# Patient Record
Sex: Female | Born: 1990
Health system: Southern US, Community
[De-identification: ages and names within clinical notes are randomized; demographics above are authoritative.]

## PROBLEM LIST (undated history)

## (undated) ENCOUNTER — Inpatient Hospital Stay (HOSPITAL_COMMUNITY): Payer: Self-pay

## (undated) DIAGNOSIS — N96 Recurrent pregnancy loss: Secondary | ICD-10-CM

## (undated) DIAGNOSIS — Z6841 Body Mass Index (BMI) 40.0 and over, adult: Secondary | ICD-10-CM

## (undated) DIAGNOSIS — O24419 Gestational diabetes mellitus in pregnancy, unspecified control: Secondary | ICD-10-CM

## (undated) DIAGNOSIS — R519 Headache, unspecified: Secondary | ICD-10-CM

## (undated) HISTORY — DX: Body Mass Index (BMI) 40.0 and over, adult: Z684

## (undated) HISTORY — DX: Gestational diabetes mellitus in pregnancy, unspecified control: O24.419

## (undated) HISTORY — DX: Recurrent pregnancy loss: N96

## (undated) NOTE — Anesthesia Preprocedure Evaluation (Signed)
 Formatting of this note is different from the original. Anesthesia ROS/Med History  The patient does not have a history of anesthetic complications.   Neuro/Psych  + current substance abuse: abuses marijuana  + psychiatric history, type: depression and anxiety  Pulmonary  + tobacco use  + ex-smoker Cardiovascular  + hypertension (Hx PIH) + hyperlipidemia   GI/Hepatic/Renal  + GERD  Undergoing eval for bariatric surg.:  Hematology  Patient has a negative hematology ROS  Endo/Cancer/Other  Diabetes: Hx Gestational DM. + obesity (MORBID--BMI 46.3)  11/01/22 08:52 Hemoglobin A1c: 4.8 Mean Glucose: 91.06:   Other Review of Systems findings:  Pre-op diagnosis: Gastroesophageal reflux disease, unspecified whether esophagitis present (K21.9)  Anesthesia Physical Exam Airway  Mallampati: III Pulmonary  Normal systems: pulmonary exam normal - wheezes:  Cardiovascular  rate: normal   Removed tongue ring in preop. Left lower lip piercing (plastic).PreOp vitals: BP: 115/66 (12/02/2022 12:56 PM) TEMP: 36.4 C (12/02/2022 12:56 PM) Pulse:  54 (12/02/2022 12:56 PM) Resp:  16 (12/02/2022 12:56 PM) SPO2: 100 % (12/02/2022 12:56 PM)  Anesthesia Plan ASA Score: 3   Anesthesia Plan of Care:  general IV Plan Monitor  Monitor: routine Postoperative Plan  Post Operative Plan: routine  Informed consent  Anesthetic Plan and risks discussed with the patient.  Discussed the anesthetic plan with the Anesthesiologist and CRNA.  Information  H & P update: patient reports no changes  Relevant Problems   Stop Bang Score/Comments: No value filed. Mini-Cog Score: No value filed. PONV Risk Score: No value filed. Frail Scale Score: No value filed.   Electronically signed by Barnie DELENA Begun, RN at 11/30/2022 11:11 AM EDT Electronically signed by Arlyne Carbo, MD at 11/30/2022  2:00 PM EDT Electronically signed by Olam DELENA. Leartis, DO at 12/02/2022  1:27 PM EDT

## (undated) NOTE — Anesthesia Postprocedure Evaluation (Signed)
 Formatting of this note is different from the original. Patient: Kelly Stevens  Procedure(s) Performed: Procedure(s): EGD  Anesthesia Post Evaluation  Patient participation: Patient participated Level of consciousness: awake and alert Pain score: 0 Airway patency: patent Cardiovascular status: stable Respiratory status: stable Hydration status: euvolemic Nausea and Vomiting: none  No notable events documented.  Last Vitals:  Vitals Value Taken Time  BP 105/84 12/02/22 1415  Temp 36.2 C 12/02/22 1415  Pulse 66 12/02/22 1415  Resp 16 12/02/22 1415  SpO2 100 % 12/02/22 1415  SpO2 Pulse Rate     Per GI nursing notes: GI Symptoms: None  First PACU pain score  12/02/2022 1349 - 12/02/2022 1434     12/02/2022 1405      Pain Score: 0-No pain    Electronically signed by Olam LABOR. Leartis, DO at 12/02/2022  2:38 PM EDT

---

## 2013-05-08 ENCOUNTER — Other Ambulatory Visit (HOSPITAL_COMMUNITY)
Admission: RE | Admit: 2013-05-08 | Discharge: 2013-05-08 | Disposition: A | Discharge status: Home / Self Care | Payer: BC Managed Care – PPO | Source: Ambulatory Visit | Attending: Obstetrics and Gynecology | Admitting: Obstetrics and Gynecology

## 2013-05-08 DIAGNOSIS — Z01419 Encounter for gynecological examination (general) (routine) without abnormal findings: Secondary | ICD-10-CM | POA: Insufficient documentation

## 2015-08-15 ENCOUNTER — Emergency Department (HOSPITAL_COMMUNITY)
Admission: EM | Admit: 2015-08-15 | Discharge: 2015-08-15 | Disposition: A | Discharge status: Home / Self Care | Payer: No Typology Code available for payment source | Attending: Emergency Medicine | Admitting: Emergency Medicine

## 2015-08-15 ENCOUNTER — Emergency Department (HOSPITAL_COMMUNITY): Payer: No Typology Code available for payment source

## 2015-08-15 ENCOUNTER — Encounter (HOSPITAL_COMMUNITY): Payer: Self-pay | Admitting: Emergency Medicine

## 2015-08-15 DIAGNOSIS — S199XXA Unspecified injury of neck, initial encounter: Secondary | ICD-10-CM | POA: Diagnosis not present

## 2015-08-15 DIAGNOSIS — Y92410 Unspecified street and highway as the place of occurrence of the external cause: Secondary | ICD-10-CM | POA: Diagnosis not present

## 2015-08-15 DIAGNOSIS — Y998 Other external cause status: Secondary | ICD-10-CM | POA: Diagnosis not present

## 2015-08-15 DIAGNOSIS — Y9389 Activity, other specified: Secondary | ICD-10-CM | POA: Diagnosis not present

## 2015-08-15 MED ORDER — IBUPROFEN 800 MG PO TABS: 800.0000 mg | ORAL_TABLET | Freq: Three times a day (TID) | ORAL | Status: DC | PRN

## 2015-08-15 MED ORDER — IBUPROFEN 400 MG PO TABS
600.0000 mg | ORAL_TABLET | Freq: Once | ORAL | Status: AC
  Administered 2015-08-15: 600 mg via ORAL
  Filled 2015-08-15: qty 1

## 2015-08-15 MED ORDER — HYDROCODONE-ACETAMINOPHEN 5-325 MG PO TABS: 1.0000 | ORAL_TABLET | Freq: Four times a day (QID) | ORAL | Status: DC | PRN

## 2015-08-15 NOTE — Discharge Instructions (Signed)
Return here as needed.  Follow-up with your doctor for the abnormal findings that we discussed or the Dr. Provided.
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
## 2015-08-15 NOTE — ED Notes (Signed)
Pt able to ambulate independently 
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
## 2017-09-25 ENCOUNTER — Inpatient Hospital Stay (HOSPITAL_COMMUNITY): Payer: Self-pay

## 2017-09-25 ENCOUNTER — Encounter (HOSPITAL_COMMUNITY): Payer: Self-pay | Admitting: *Deleted

## 2017-09-25 ENCOUNTER — Other Ambulatory Visit: Payer: Self-pay

## 2017-09-25 ENCOUNTER — Inpatient Hospital Stay (HOSPITAL_COMMUNITY)
Admission: AD | Admit: 2017-09-25 | Discharge: 2017-09-25 | Disposition: A | Discharge status: Home / Self Care | Payer: Self-pay | Source: Ambulatory Visit | Attending: Obstetrics and Gynecology | Admitting: Obstetrics and Gynecology

## 2017-09-25 DIAGNOSIS — Z87891 Personal history of nicotine dependence: Secondary | ICD-10-CM | POA: Insufficient documentation

## 2017-09-25 DIAGNOSIS — O3680X0 Pregnancy with inconclusive fetal viability, not applicable or unspecified: Secondary | ICD-10-CM

## 2017-09-25 DIAGNOSIS — O26891 Other specified pregnancy related conditions, first trimester: Secondary | ICD-10-CM | POA: Insufficient documentation

## 2017-09-25 DIAGNOSIS — O99211 Obesity complicating pregnancy, first trimester: Secondary | ICD-10-CM | POA: Insufficient documentation

## 2017-09-25 DIAGNOSIS — O209 Hemorrhage in early pregnancy, unspecified: Secondary | ICD-10-CM

## 2017-09-25 DIAGNOSIS — Z3A01 Less than 8 weeks gestation of pregnancy: Secondary | ICD-10-CM | POA: Insufficient documentation

## 2017-09-25 DIAGNOSIS — R030 Elevated blood-pressure reading, without diagnosis of hypertension: Secondary | ICD-10-CM | POA: Insufficient documentation

## 2017-09-25 LAB — CBC
HEMATOCRIT: 36.2 % (ref 36.0–46.0)
HEMOGLOBIN: 11.6 g/dL — AB (ref 12.0–15.0)
MCH: 23.8 pg — ABNORMAL LOW (ref 26.0–34.0)
MCHC: 32 g/dL (ref 30.0–36.0)
MCV: 74.2 fL — AB (ref 78.0–100.0)
Platelets: 309 10*3/uL (ref 150–400)
RBC: 4.88 MIL/uL (ref 3.87–5.11)
RDW: 15.5 % (ref 11.5–15.5)
WBC: 7 10*3/uL (ref 4.0–10.5)

## 2017-09-25 LAB — URINALYSIS, ROUTINE W REFLEX MICROSCOPIC
BILIRUBIN URINE: NEGATIVE
Bacteria, UA: NONE SEEN
GLUCOSE, UA: NEGATIVE mg/dL
Ketones, ur: NEGATIVE mg/dL
LEUKOCYTES UA: NEGATIVE
NITRITE: NEGATIVE
PH: 7 (ref 5.0–8.0)
Protein, ur: 30 mg/dL — AB
SPECIFIC GRAVITY, URINE: 1.024 (ref 1.005–1.030)

## 2017-09-25 LAB — HCG, QUANTITATIVE, PREGNANCY: hCG, Beta Chain, Quant, S: 5103 m[IU]/mL — ABNORMAL HIGH (ref <5)

## 2017-09-25 LAB — WET PREP, GENITAL
CLUE CELLS WET PREP: NONE SEEN
SPERM: NONE SEEN
Trich, Wet Prep: NONE SEEN
WBC WET PREP: NONE SEEN
YEAST WET PREP: NONE SEEN

## 2017-09-25 LAB — ABO/RH: ABO/RH(D): B POS

## 2017-09-25 LAB — POCT PREGNANCY, URINE: Preg Test, Ur: POSITIVE — AB

## 2017-09-25 NOTE — MAU Provider Note (Signed)
History     CSN: [id number]  Arrival date and time: 09/25/17 1150   First Provider Initiated Contact with Patient 09/25/17 1234      Chief Complaint  Patient presents with  . Vaginal Bleeding   HPI Kelly Stevens 27 y.o. [redacted]w[redacted]d  Did a home pregnancy test that was positive last week.  Started having vaginal bleeding today and is worried she is having a miscarriage.  She does have some mild pain  - the same pain that she has had since she missed her period.  It is no worse.  Has not yet started prenatal care.  Was scheduled in [facility] for a pregnancy confirmation this week.  OB History    Gravida Para Term Preterm AB Living   1             SAB TAB Ectopic Multiple Live Births                  History reviewed. No pertinent past medical history.  History reviewed. No pertinent surgical history.  Family History - strong family history of hypertension  Social History   Tobacco Use  . Smoking status: Former Smoker    Last attempt to quit: 09/25/2017  . Smokeless tobacco: Former Engineer, water Use Topics  . Alcohol use: No  . Drug use: No    Allergies: No Known Allergies  Medications Prior to Admission  Medication Sig Dispense Refill Last Dose  . HYDROcodone-acetaminophen (NORCO/VICODIN) 5-325 MG tablet Take 1 tablet by mouth every 6 (six) hours as needed for moderate pain. 15 tablet 0   . ibuprofen (ADVIL,MOTRIN) 800 MG tablet Take 1 tablet (800 mg total) by mouth every 8 (eight) hours as needed. 21 tablet 0     Review of Systems  Constitutional: Negative for fever.  Gastrointestinal: Negative for nausea and vomiting.       Mild lower abdominal cramping  Genitourinary: Positive for vaginal bleeding. Negative for vaginal discharge.       Passed one small clot at home - approx quarter sized   Physical Exam   Blood pressure (!) 141/95, pulse (!) 180, temperature 98.5 F (36.9 C), temperature source Oral, resp. rate 20, height 5\' 6"  (1.676 m), weight (!) 332 lb  12 oz (150.9 kg), SpO2 99 %.  Pulse was rechecked by continuous pulse ox.  O2 sat was 96-100%  Pulse was always 90-120 with most often around 100-110.    Physical Exam  Nursing note and vitals reviewed. Constitutional: She is oriented to person, place, and time. She appears well-developed and well-nourished.  Morbidly obese  HENT:  Head: Normocephalic.  Eyes: EOM are normal.  Neck: Neck supple.  GI: Soft. There is no tenderness. There is no rebound and no guarding.  Genitourinary:  Genitourinary Comments: Speculum exam: Vagina - small to moderate amount of dark blood, no odor Cervix - Unable to visualize Bimanual exam: Cervix  - unable to palpate - is behind the prominent pelvic bone. Uterus Not able to be sized due to habitus Adnexa non tender, exam limited by habitus GC/Chlam, wet prep done Chaperone present for exam.   Musculoskeletal: Normal range of motion.  Neurological: She is alert and oriented to person, place, and time.  Skin: Skin is warm and dry.  Psychiatric: She has a normal mood and affect.    MAU Course  Procedures Results for orders placed or performed during the hospital encounter of 09/25/17 (from the past 24 hour(s))  Urinalysis, Routine w  reflex microscopic     Status: Abnormal   Collection Time: 09/25/17 12:00 PM  Result Value Ref Range   Color, Urine YELLOW YELLOW   APPearance HAZY (A) CLEAR   Specific Gravity, Urine 1.024 1.005 - 1.030   pH 7.0 5.0 - 8.0   Glucose, UA NEGATIVE NEGATIVE mg/dL   Hgb urine dipstick LARGE (A) NEGATIVE   Bilirubin Urine NEGATIVE NEGATIVE   Ketones, ur NEGATIVE NEGATIVE mg/dL   Protein, ur 30 (A) NEGATIVE mg/dL   Nitrite NEGATIVE NEGATIVE   Leukocytes, UA NEGATIVE NEGATIVE   RBC / HPF TOO NUMEROUS TO COUNT 0 - 5 RBC/hpf   WBC, UA 6-30 0 - 5 WBC/hpf   Bacteria, UA NONE SEEN NONE SEEN   Squamous Epithelial / LPF 6-30 (A) NONE SEEN   Mucus PRESENT   Pregnancy, urine POC     Status: Abnormal   Collection Time:  09/25/17 12:07 PM  Result Value Ref Range   Preg Test, Ur POSITIVE (A) NEGATIVE  CBC     Status: Abnormal   Collection Time: 09/25/17 12:49 PM  Result Value Ref Range   WBC 7.0 4.0 - 10.5 K/uL   RBC 4.88 3.87 - 5.11 MIL/uL   Hemoglobin 11.6 (L) 12.0 - 15.0 g/dL   HCT 09.8 11.9 - 14.7 %   MCV 74.2 (L) 78.0 - 100.0 fL   MCH 23.8 (L) 26.0 - 34.0 pg   MCHC 32.0 30.0 - 36.0 g/dL   RDW 82.9 56.2 - 13.0 %   Platelets 309 150 - 400 K/uL  hCG, quantitative, pregnancy     Status: Abnormal   Collection Time: 09/25/17 12:49 PM  Result Value Ref Range   hCG, Beta Chain, Quant, S 5,103 (H) <5 mIU/mL  ABO/Rh     Status: None   Collection Time: 09/25/17 12:49 PM  Result Value Ref Range   ABO/RH(D) B POS   Wet prep, genital     Status: None   Collection Time: 09/25/17  1:45 PM  Result Value Ref Range   Yeast Wet Prep HPF POC NONE SEEN NONE SEEN   Trich, Wet Prep NONE SEEN NONE SEEN   Clue Cells Wet Prep HPF POC NONE SEEN NONE SEEN   WBC, Wet Prep HPF POC NONE SEEN NONE SEEN   Sperm NONE SEEN    CLINICAL DATA:  Vaginal bleeding in first trimester of pregnancy, bleeding with clots for 1 day, cramps  EXAM: OBSTETRIC <14 WK [place] AND TRANSVAGINAL OB US  TECHNIQUE: Both transabdominal and transvaginal ultrasound examinations were performed for complete evaluation of the gestation as well as the maternal uterus, adnexal regions, and pelvic cul-de-sac. Transvaginal technique was performed to assess early pregnancy.  COMPARISON:  None  FINDINGS: Intrauterine gestational sac: None  Yolk sac:  N/A  Embryo:  N/A  Cardiac Activity: N/A  Heart Rate: N/A  bpm  Subchorionic hemorrhage:  N/A  Maternal uterus/adnexae:  Uterus appears normal in size with a borderline thickened and heterogeneous appearing endometrial complex 15 mm thick; no definite gestational sac or products of conception identified.  RIGHT ovary normal size and morphology 3.7 x 1.5 x 1.9 cm.  LEFT ovary  not visualized.  No free pelvic fluid or adnexal masses.  IMPRESSION: No intrauterine gestation identified.  Findings are compatible with pregnancy of unknown location.  Differential diagnosis includes early intrauterine pregnancy too early to visualize, spontaneous abortion, and ectopic pregnancy.  Serial quantitative beta hCG and or followup ultrasound recommended to definitively exclude ectopic pregnancy.  Heterogeneous  appearance of the endometrial complex up to 15 mm thick, nonspecific.   MDM Blood type is B positive Consult with Dr. Jolayne Pantheronstant - will follow up for quant in 48 hours. BP on discharge was 132/63 Discussed with client and her partner:  Possibility of ectopic pregnancy, an early but healthy pregnancy or a threatened miscarriage - will need further evaluation to determine which is occurring.  Discussed the importance of follow up as an ectopic pregnancy can be life threatening.  Assessment and Plan  Pregnancy of unknown anatomic location Bleeding in pregnancy in first trimester Morbid obesity Elevated BP without diagnosis of hypertension -   Plan Come on Wednesday at 1:30 pm [facility]  Plan to be at the appointment for 2 hours. Return to MAU with severe vaginal bleeding or severe abdominal pain.  Pelvic rest - no tampons, no douching, no sex, nothing in the vagina. No heavy lifting or strenuous exercise. Drink at least 8 8-oz glasses of water every day. Take Tylenol 325 mg 2 tablets by mouth every 4 hours if needed for pain. will need to watch BP carefully in this pregnancy.   Julius Boniface L Jerrell Mangel 09/25/2017, 1:52 PM

## 2017-09-25 NOTE — Discharge Instructions (Signed)
Come on Wednesday at 1:30 pm for lab work.  Plan to be at the appointment for 2 hours. Return to MAU with severe vaginal bleeding or severe abdominal pain.  Pelvic rest - no tampons, no douching, no sex, nothing in the vagina. No heavy lifting or strenuous exercise. Drink at least 8 8-oz glasses of water every day. Take Tylenol 325 mg 2 tablets by mouth every 4 hours if needed for pain.

## 2017-09-25 NOTE — MAU Note (Signed)
Pt. Here with vaginal bleeding that started this morning, with "regular period clots". Pain 2/10 mild uterine cramps.

## 2017-09-26 ENCOUNTER — Encounter: Payer: Self-pay | Admitting: Family Medicine

## 2017-09-26 LAB — GC/CHLAMYDIA PROBE AMP (~~LOC~~) NOT AT ARMC
Chlamydia: NEGATIVE
Neisseria Gonorrhea: NEGATIVE

## 2017-09-26 LAB — RPR: RPR Ser Ql: NONREACTIVE

## 2017-09-26 LAB — HIV ANTIBODY (ROUTINE TESTING W REFLEX): HIV SCREEN 4TH GENERATION: NONREACTIVE

## 2017-09-27 ENCOUNTER — Encounter: Payer: Self-pay | Admitting: General Practice

## 2017-09-27 ENCOUNTER — Ambulatory Visit: Payer: Self-pay | Admitting: General Practice

## 2017-09-27 DIAGNOSIS — O3680X0 Pregnancy with inconclusive fetal viability, not applicable or unspecified: Secondary | ICD-10-CM

## 2017-09-27 LAB — HCG, QUANTITATIVE, PREGNANCY: hCG, Beta Chain, Quant, S: 646 m[IU]/mL — ABNORMAL HIGH (ref <5)

## 2017-09-27 NOTE — Progress Notes (Signed)
Patient here for stat bhcg today. Patient reports bleeding has slowed down to spotting. Patient denies pain. Told patient we are monitoring her beta hcg levels today and asked she wait in lobby for results/updated plan of care. Patient verbalized understanding & had no questions at this time  Reviewed results with Cleone Slimaroline Neill who finds decreasing bhcg levels consistent with SAB. Patient should have follow up bhcg in 1 week.  Informed patient of results & recommended follow up. Patient verbalized understanding and had no questions

## 2017-09-27 NOTE — Progress Notes (Signed)
Patient ID: Kelly Stevens, female   DOB: 1991/04/25, 27 y.o.   MRN: [id number]  Upon checking out patient, patient stated that she did not need to be scheduled to see a provider for SAB follow up.  Asked patient is she was sure and she said yes. She stated that she will call if she feel that she need to see a doctor.  Patient is scheduled for follow up lab visit.

## 2017-09-28 NOTE — Progress Notes (Signed)
Chart reviewed for nurse visit. Agree with plan of care.   Rolm Bookbindereill, Caroline M, [place] 09/28/2017 12:55 PM

## 2017-10-06 ENCOUNTER — Other Ambulatory Visit: Payer: Self-pay

## 2017-10-06 DIAGNOSIS — O039 Complete or unspecified spontaneous abortion without complication: Secondary | ICD-10-CM

## 2018-08-15 ENCOUNTER — Encounter (HOSPITAL_COMMUNITY): Payer: Self-pay

## 2019-01-04 ENCOUNTER — Ambulatory Visit (INDEPENDENT_AMBULATORY_CARE_PROVIDER_SITE_OTHER): Payer: Self-pay | Admitting: *Deleted

## 2019-01-04 ENCOUNTER — Other Ambulatory Visit: Payer: Self-pay

## 2019-01-04 ENCOUNTER — Encounter: Payer: Self-pay | Admitting: Family Medicine

## 2019-01-04 VITALS — BP 153/77 | HR 69 | Ht 65.5 in | Wt 325.2 lb

## 2019-01-04 DIAGNOSIS — Z3201 Encounter for pregnancy test, result positive: Secondary | ICD-10-CM

## 2019-01-04 DIAGNOSIS — Z8759 Personal history of other complications of pregnancy, childbirth and the puerperium: Secondary | ICD-10-CM

## 2019-01-04 LAB — POCT PREGNANCY, URINE: Preg Test, Ur: POSITIVE — AB

## 2019-01-04 MED ORDER — PROGESTERONE MICRONIZED 200 MG PO CAPS: 200.0000 mg | ORAL_CAPSULE | Freq: Every day | ORAL | 1 refills | Status: DC

## 2019-01-04 NOTE — Progress Notes (Signed)
Pt presents for pregnancy test.  Pregnancy test resulted positive.   LMP: 12/01/18 EDD:09/06/18 GA:[redacted]w[redacted]d  Pt states she miscarried twice last year and the doctor she went to and she saw that her progesterone was 7 during each test and that she was told she should have been prescribed progesterone.  Pt states she was told that with her next pregnancy, she should be prescribe progesterone early. Reviewed with Dr. Vergie Living.  Pt prescribed Prometrium 200mg  to be adminstered vaginally nightly.  Pt verbalized understanding.

## 2019-01-07 ENCOUNTER — Other Ambulatory Visit: Payer: Self-pay

## 2019-01-07 ENCOUNTER — Emergency Department
Admission: EM | Admit: 2019-01-07 | Discharge: 2019-01-07 | Disposition: A | Discharge status: Home / Self Care | Payer: Self-pay | Attending: Student in an Organized Health Care Education/Training Program | Admitting: Student in an Organized Health Care Education/Training Program

## 2019-01-07 ENCOUNTER — Telehealth: Payer: Self-pay

## 2019-01-07 ENCOUNTER — Encounter: Payer: Self-pay | Admitting: Emergency Medicine

## 2019-01-07 ENCOUNTER — Emergency Department: Payer: Self-pay

## 2019-01-07 DIAGNOSIS — O4691 Antepartum hemorrhage, unspecified, first trimester: Secondary | ICD-10-CM | POA: Insufficient documentation

## 2019-01-07 DIAGNOSIS — Z3A00 Weeks of gestation of pregnancy not specified: Secondary | ICD-10-CM | POA: Insufficient documentation

## 2019-01-07 DIAGNOSIS — O4690 Antepartum hemorrhage, unspecified, unspecified trimester: Secondary | ICD-10-CM

## 2019-01-07 LAB — CBC
HCT: 37.5 % (ref 36.0–46.0)
Hemoglobin: 11.6 g/dL — ABNORMAL LOW (ref 12.0–15.0)
MCH: 22.7 pg — ABNORMAL LOW (ref 26.0–34.0)
MCHC: 30.9 g/dL (ref 30.0–36.0)
MCV: 73.2 fL — ABNORMAL LOW (ref 80.0–100.0)
Platelets: 362 10*3/uL (ref 150–400)
RBC: 5.12 MIL/uL — ABNORMAL HIGH (ref 3.87–5.11)
RDW: 16.1 % — ABNORMAL HIGH (ref 11.5–15.5)
WBC: 6.4 10*3/uL (ref 4.0–10.5)
nRBC: 0 % (ref 0.0–0.2)

## 2019-01-07 LAB — COMPREHENSIVE METABOLIC PANEL
ALT: 13 U/L (ref 0–44)
AST: 17 U/L (ref 15–41)
Albumin: 4 g/dL (ref 3.5–5.0)
Alkaline Phosphatase: 88 U/L (ref 38–126)
Anion gap: 9 (ref 5–15)
BUN: 14 mg/dL (ref 6–20)
CO2: 23 mmol/L (ref 22–32)
Calcium: 8.9 mg/dL (ref 8.9–10.3)
Chloride: 104 mmol/L (ref 98–111)
Creatinine, Ser: 0.96 mg/dL (ref 0.44–1.00)
GFR calc Af Amer: >60 mL/min (ref >60)
GFR calc non Af Amer: >60 mL/min (ref >60)
Glucose, Bld: 106 mg/dL — ABNORMAL HIGH (ref 70–99)
Potassium: 3.9 mmol/L (ref 3.5–5.1)
Sodium: 136 mmol/L (ref 135–145)
Total Bilirubin: 0.2 mg/dL — ABNORMAL LOW (ref 0.3–1.2)
Total Protein: 7.7 g/dL (ref 6.5–8.1)

## 2019-01-07 LAB — SAMPLE TO BLOOD BANK

## 2019-01-07 LAB — POCT PREGNANCY, URINE: Preg Test, Ur: NEGATIVE

## 2019-01-07 LAB — HCG, QUANTITATIVE, PREGNANCY: hCG, Beta Chain, Quant, S: 48 m[IU]/mL — ABNORMAL HIGH (ref <5)

## 2019-01-07 NOTE — Progress Notes (Signed)
I have reviewed the chart and agree with nursing staff's documentation of this patient's encounter.  Dante Bing, MD 01/07/2019 11:31 AM

## 2019-01-07 NOTE — ED Triage Notes (Signed)
Pt reports no pain to abd.

## 2019-01-07 NOTE — ED Notes (Signed)
Patient report did 5 pregnancy test at home begging last Friday all confirming positive pregnancy, also report seen at [facility] clinic  On Friday to confirm pregnancy. Was to she was pregnant. Patient in ed due to bleeding that began spotting on saturday and now having heavy bleeding that began today.

## 2019-01-07 NOTE — ED Notes (Signed)
No answer when called 

## 2019-01-07 NOTE — ED Notes (Signed)
Pt gave enough urine for POC, did not collect enough urine to send to the lab.

## 2019-01-07 NOTE — ED Provider Notes (Signed)
[facility] Emergency Department Provider Note    First MD Initiated Contact with Patient 01/07/19 1728     (approximate)  I have reviewed the triage vital signs and the nursing notes.   HISTORY  Chief Complaint Vaginal Bleeding    HPI Kelly Stevens is a 28 y.o. female presents the ER for several days of vaginal spotting.  She is roughly [redacted]weeks pregnant by LMP.  Is not having any pain.  Came to ER because she started passing some blood clots.  Does have a history of 2 miscarriages.  Denies any nausea or vomiting.  No trauma.  States that she has been taking progesterone suppositories as previously prescribed recommended by her OB/GYN.    History reviewed. No pertinent past medical history. No family history on file. History reviewed. No pertinent surgical history. There are no active problems to display for this patient.     Prior to Admission medications   Medication Sig Start Date End Date Taking? Authorizing Provider  AMBULATORY NON FORMULARY MEDICATION Medication Name: Progesterone Cream OTC, 20 mg per pump    [provider]  Prenatal Vit-Fe Fumarate-FA (PRENATAL PO) Take by mouth.    [provider]  progesterone (PROMETRIUM) 200 MG capsule Place 1 capsule (200 mg total) vaginally at bedtime. 01/04/19   Keizer Bing, MD    Allergies Nickel    Social History Social History   Tobacco Use  . Smoking status: Former Smoker    Last attempt to quit: 09/25/2017    Years since quitting: 1.2  . Smokeless tobacco: Former Engineer, water Use Topics  . Alcohol use: No  . Drug use: No    Review of Systems Patient denies headaches, rhinorrhea, blurry vision, numbness, shortness of breath, chest pain, edema, cough, abdominal pain, nausea, vomiting, diarrhea, dysuria, fevers, rashes or hallucinations unless otherwise stated above in HPI. ____________________________________________   PHYSICAL EXAM:  VITAL SIGNS: Vitals:   01/07/19 1521 01/07/19 1751  BP: (!) 141/99 138/90  Pulse: 96 98  Resp: 18 17  Temp: 99.6 F (37.6 C)   SpO2: 98% 100%    Constitutional: Alert and oriented.  Eyes: Conjunctivae are normal.  Head: Atraumatic. Nose: No congestion/rhinnorhea. Mouth/Throat: Mucous membranes are moist.   Neck: No stridor. Painless ROM.  Cardiovascular: Normal rate, regular rhythm. Grossly normal heart sounds.  Good peripheral circulation. Respiratory: Normal respiratory effort.  No retractions. Lungs CTAB. Gastrointestinal: Soft and nontender. No distention. No abdominal bruits. No CVA tenderness. Genitourinary: deferred Musculoskeletal: No lower extremity tenderness nor edema.  No joint effusions. Neurologic:  Normal speech and language. No gross focal neurologic deficits are appreciated. No facial droop Skin:  Skin is warm, dry and intact. No rash noted. Psychiatric: Mood and affect are normal. Speech and behavior are normal.  ____________________________________________   LABS (all labs ordered are listed, but only abnormal results are displayed)  Results for orders placed or performed during the hospital encounter of 01/07/19 (from the past 24 hour(s))  Sample to Blood Bank     Status: None   Collection Time: 01/07/19  3:29 PM  Result Value Ref Range   Blood Bank Specimen SAMPLE AVAILABLE FOR TESTING    Sample Expiration      01/10/2019,2359 Performed at [facility], [address] [place], [place] [address]   hCG, quantitative, pregnancy     Status: Abnormal   Collection Time: 01/07/19  3:33 PM  Result Value Ref Range   hCG, Beta Chain, Quant, S 48 (H) <  5 mIU/mL  CBC     Status: Abnormal   Collection Time: 01/07/19  3:34 PM  Result Value Ref Range   WBC 6.4 4.0 - 10.5 K/uL   RBC 5.12 (H) 3.87 - 5.11 MIL/uL   Hemoglobin 11.6 (L) 12.0 - 15.0 g/dL   HCT 16.1 09.6 - 04.5 %   MCV 73.2 (L) 80.0 - 100.0 fL   MCH 22.7 (L) 26.0 - 34.0 pg   MCHC 30.9 30.0 - 36.0 g/dL   RDW 40.9  (H) 81.1 - 15.5 %   Platelets 362 150 - 400 K/uL   nRBC 0.0 0.0 - 0.2 %  Comprehensive metabolic panel     Status: Abnormal   Collection Time: 01/07/19  3:34 PM  Result Value Ref Range   Sodium 136 135 - 145 mmol/L   Potassium 3.9 3.5 - 5.1 mmol/L   Chloride 104 98 - 111 mmol/L   CO2 23 22 - 32 mmol/L   Glucose, Bld 106 (H) 70 - 99 mg/dL   BUN 14 6 - 20 mg/dL   Creatinine, Ser 9.14 0.44 - 1.00 mg/dL   Calcium 8.9 8.9 - 78.2 mg/dL   Total Protein 7.7 6.5 - 8.1 g/dL   Albumin 4.0 3.5 - 5.0 g/dL   AST 17 15 - 41 U/L   ALT 13 0 - 44 U/L   Alkaline Phosphatase 88 38 - 126 U/L   Total Bilirubin 0.2 (L) 0.3 - 1.2 mg/dL   GFR calc non Af Amer >60 >60 mL/min   GFR calc Af Amer >60 >60 mL/min   Anion gap 9 5 - 15  Pregnancy, urine POC     Status: None   Collection Time: 01/07/19  3:38 PM  Result Value Ref Range   Preg Test, Ur NEGATIVE NEGATIVE   ____________________________________________ ____________________________________________  RADIOLOGY  I personally reviewed all radiographic images ordered to evaluate for the above acute complaints and reviewed radiology reports and findings.  These findings were personally discussed with the patient.  Please see medical record for radiology report.  ____________________________________________   PROCEDURES  Procedure(s) performed:  Procedures    Critical Care performed: no ____________________________________________   INITIAL IMPRESSION / ASSESSMENT AND PLAN / ED COURSE  Pertinent labs & imaging results that were available during my care of the patient were reviewed by me and considered in my medical decision making (see chart for details).   DDX: Ectopic, AB, DU B, miscarriage, subchorionic hematoma  Kelly Stevens is a 28 y.o. who presents to the ED with symptoms as described above.  Patient nontoxic-appearing afebrile and hemodynamically stable.  Abdominal exam is soft and benign.  She is RH pos. ultrasound shows no  evidence of IUP but no evidence of ectopic.  Patient very early pregnancy.  Will close outpatient follow-up with OB/GYN.  Discussed signs and symptoms for which she should return to the ER.    The patient was evaluated in Emergency Department today for the symptoms described in the history of present illness. He/she was evaluated in the context of the global COVID-19 pandemic, which necessitated consideration that the patient might be at risk for infection with the SARS-CoV-2 virus that causes COVID-19. Institutional protocols and algorithms that pertain to the evaluation of patients at risk for COVID-19 are in a state of rapid change based on information released by regulatory bodies including the CDC and federal and state organizations. These policies and algorithms were followed during the patient's care in the ED.  As part of my  medical decision making, I reviewed the following data within the electronic MEDICAL RECORD NUMBER Nursing notes reviewed and incorporated, Labs reviewed, notes from prior ED visits and [place] Controlled Substance Database   ____________________________________________   FINAL CLINICAL IMPRESSION(S) / ED DIAGNOSES  Final diagnoses:  Vaginal bleeding in pregnancy      NEW MEDICATIONS STARTED DURING THIS VISIT:  New Prescriptions   No medications on file     Note:  This document was prepared using Dragon voice recognition software and may include unintentional dictation errors.    Willy Eddyobinson, Fawaz Borquez, MD 01/07/19 Silva [id number]

## 2019-01-07 NOTE — Discharge Instructions (Signed)
You may continue using her progesterone medication.  Please follow-up with [facility] OB[facility]GYN this week.

## 2019-01-07 NOTE — Telephone Encounter (Signed)
Pt called and stated that she needed to get in contact with someone because she has been bleeding since Friday.  Called pt and pt informed me that she just checked into the ER and they have already taken blood work.  I informed pt that if she continues to have questions to please give the office a call. Pt verbalized understanding.

## 2019-01-07 NOTE — ED Triage Notes (Signed)
Pt reports is [redacted] weeks pregnant and started bleeding at work. Pt denies clots, reports the amount was like her period was starting. Pt reports has some abd as well and states had 2 miscarriages last year.

## 2019-01-08 ENCOUNTER — Other Ambulatory Visit: Payer: Self-pay | Admitting: Obstetrics & Gynecology

## 2019-01-08 ENCOUNTER — Telehealth: Payer: Self-pay | Admitting: Obstetrics & Gynecology

## 2019-01-08 DIAGNOSIS — Z8759 Personal history of other complications of pregnancy, childbirth and the puerperium: Secondary | ICD-10-CM

## 2019-01-08 NOTE — Telephone Encounter (Signed)
Pt coming in on 01/09/2019 for f/u lab work from the ER for HSG. Could we get a order put in for her appointment.   Thanks

## 2019-01-09 ENCOUNTER — Other Ambulatory Visit: Payer: Self-pay

## 2019-01-10 ENCOUNTER — Other Ambulatory Visit: Payer: Self-pay

## 2019-01-10 ENCOUNTER — Encounter: Payer: Self-pay | Admitting: Obstetrics and Gynecology

## 2019-01-10 ENCOUNTER — Ambulatory Visit (INDEPENDENT_AMBULATORY_CARE_PROVIDER_SITE_OTHER): Payer: Self-pay | Admitting: Obstetrics and Gynecology

## 2019-01-10 VITALS — BP 138/90 | Ht 65.5 in | Wt 325.0 lb

## 2019-01-10 DIAGNOSIS — Z803 Family history of malignant neoplasm of breast: Secondary | ICD-10-CM

## 2019-01-10 DIAGNOSIS — Z30011 Encounter for initial prescription of contraceptive pills: Secondary | ICD-10-CM

## 2019-01-10 DIAGNOSIS — N96 Recurrent pregnancy loss: Secondary | ICD-10-CM

## 2019-01-10 MED ORDER — APRI 0.15-30 MG-MCG PO TABS: 1.0000 | ORAL_TABLET | Freq: Every day | ORAL | 1 refills | Status: DC

## 2019-01-10 NOTE — Patient Instructions (Signed)
I value your feedback and entrusting us with your care. If you get a [facility] patient survey, I would appreciate you taking the time to let us know about your experience today. Thank you! 

## 2019-01-10 NOTE — Progress Notes (Signed)
Patient, No Pcp Per   Chief Complaint  Patient presents with   Contraception    interested in pills    HPI:      [name] is a 28 y.o. G3P0030 who LMP was Patient's last menstrual period was 12/01/2018 (exact date)., [facility]. Pt recently moved here from [place]. Pt is s/p SAB earlier this wk. Pt was about [redacted] wks pregnant per LMP and went to ED for bleeding and clots. Had neg u/s, quant beta HCG was 48. Pt still having mild bleeding. Was supposed to have repeat beta HcG today but declines since self-pay.  Hx of two previous SAB and pt would like further eval to determine cause, but wants to wait until she gets insurance in a few months. She would like to go back on OCPs in meantime to prevent pregnancy.  Menses are monthly, lasting 4-5 day, no BTB, mild to mod dysmen, improved with NSAIDs. Pt has been on apri brand only in past with good cycle control and no side effects. Would like to restart it. No hx of HTN, DVTs, migraines with aura.  Pt had annual/pap with GYN in [place] last yr.    Past Medical History:  Diagnosis Date   BMI 50.0-59.9, adult (HCC)    Habitual aborter     History reviewed. No pertinent surgical history.  Family History  Problem Relation Age of Onset   Breast cancer Maternal Aunt 7040   Uterine cancer Maternal Aunt 9034    Social History   Socioeconomic History   Marital status: Married    Spouse name: Not on file   Number of children: Not on file   Years of education: Not on file   Highest education level: Not on file  Occupational History   Not on file  Social Needs   Financial resource strain: Not on file   Food insecurity:    Worry: Not on file    Inability: Not on file   Transportation needs:    Medical: Not on file    Non-medical: Not on file  Tobacco Use   Smoking status: Former Smoker    Last attempt to quit: 09/25/2017    Years since quitting: 1.2   Smokeless tobacco: Former Engineer, waterUser  Substance and  Sexual Activity   Alcohol use: No   Drug use: No   Sexual activity: Yes    Birth control/protection: None  Lifestyle   Physical activity:    Days per week: Not on file    Minutes per session: Not on file   Stress: Not on file  Relationships   Social connections:    Talks on phone: Not on file    Gets together: Not on file    Attends religious service: Not on file    Active member of club or organization: Not on file    Attends meetings of clubs or organizations: Not on file    Relationship status: Not on file   Intimate partner violence:    Fear of current or ex partner: Not on file    Emotionally abused: Not on file    Physically abused: Not on file    Forced sexual activity: Not on file  Other Topics Concern   Not on file  Social History Narrative   Not on file    Outpatient Medications Prior to Visit  Medication Sig Dispense Refill   AMBULATORY NON FORMULARY MEDICATION Medication Name: Progesterone Cream OTC, 20 mg per pump  Prenatal Vit-Fe Fumarate-FA (PRENATAL PO) Take 1 tablet by mouth daily.      progesterone (PROMETRIUM) 200 MG capsule Place 1 capsule (200 mg total) vaginally at bedtime. 30 capsule 1   No facility-administered medications prior to visit.      ROS:  Review of Systems  Constitutional: Positive for fatigue. Negative for fever and unexpected weight change.  Respiratory: Negative for cough, shortness of breath and wheezing.   Cardiovascular: Negative for chest pain, palpitations and leg swelling.  Gastrointestinal: Negative for blood in stool, constipation, diarrhea, nausea and vomiting.  Endocrine: Negative for cold intolerance, heat intolerance and polyuria.  Genitourinary: Positive for vaginal bleeding. Negative for dyspareunia, dysuria, flank pain, frequency, genital sores, hematuria, menstrual problem, pelvic pain, urgency, vaginal discharge and vaginal pain.  Musculoskeletal: Negative for back pain, joint swelling and myalgias.    Skin: Negative for rash.  Neurological: Negative for dizziness, syncope, light-headedness, numbness and headaches.  Hematological: Negative for adenopathy.  Psychiatric/Behavioral: Positive for agitation and dysphoric mood. Negative for confusion, sleep disturbance and suicidal ideas. The patient is not nervous/anxious.      OBJECTIVE:   Vitals:  BP 138/90    Ht 5' 5.5" (1.664 m)    Wt (!) 325 lb (147.4 kg)    LMP 12/01/2018 (Exact Date)    Breastfeeding No    BMI 53.26 kg/m   Physical Exam Vitals signs reviewed.  Constitutional:      Appearance: She is well-developed.  Neck:     Musculoskeletal: Normal range of motion.  Pulmonary:     Effort: Pulmonary effort is normal.  Musculoskeletal: Normal range of motion.  Skin:    General: Skin is warm and dry.  Neurological:     General: No focal deficit present.     Mental Status: She is alert and oriented to person, place, and time.     Cranial Nerves: No cranial nerve deficit.  Psychiatric:        Mood and Affect: Mood normal.        Behavior: Behavior normal.        Thought Content: Thought content normal.        Judgment: Judgment normal.      Assessment/Plan: Encounter for initial prescription of contraceptive pills - Pt would like to restart apri. Rx eRxd. Start after neg home UPT Sun. If not neg, wait till menses. Condoms for 1 wk. - Plan: APRI 0.15-30 MG-MCG tablet  Habitual aborter - Pt to RTO when has ins for further eval.   Family history of breast cancer - Needs to be reviewed again when has ins. Qualifies for [facility] testing.     Meds ordered this encounter  Medications   APRI 0.15-30 MG-MCG tablet    Sig: Take 1 tablet by mouth daily.    Dispense:  84 tablet    Refill:  1    BRAND ONLY    Order Specific Question:   Supervising Provider    Answer:   Nadara Mustard [[id number]]      Return if symptoms worsen or fail to improve.  Kelly Glantz B. Mayara Paulson, PA-C 01/10/2019 3:24 PM

## 2019-04-10 ENCOUNTER — Other Ambulatory Visit (HOSPITAL_COMMUNITY)
Admission: RE | Admit: 2019-04-10 | Discharge: 2019-04-10 | Disposition: A | Discharge status: Home / Self Care | Payer: Self-pay | Source: Ambulatory Visit | Attending: Maternal Newborn | Admitting: Maternal Newborn

## 2019-04-10 ENCOUNTER — Encounter: Payer: Self-pay | Admitting: Maternal Newborn

## 2019-04-10 ENCOUNTER — Ambulatory Visit (INDEPENDENT_AMBULATORY_CARE_PROVIDER_SITE_OTHER): Payer: BC Managed Care – PPO | Admitting: Maternal Newborn

## 2019-04-10 ENCOUNTER — Other Ambulatory Visit: Payer: Self-pay

## 2019-04-10 VITALS — BP 130/80 | Ht 65.5 in | Wt 323.0 lb

## 2019-04-10 DIAGNOSIS — Z113 Encounter for screening for infections with a predominantly sexual mode of transmission: Secondary | ICD-10-CM

## 2019-04-10 DIAGNOSIS — R3915 Urgency of urination: Secondary | ICD-10-CM

## 2019-04-10 DIAGNOSIS — Z124 Encounter for screening for malignant neoplasm of cervix: Secondary | ICD-10-CM

## 2019-04-10 DIAGNOSIS — Z01419 Encounter for gynecological examination (general) (routine) without abnormal findings: Secondary | ICD-10-CM

## 2019-04-10 NOTE — Patient Instructions (Signed)
Therapists/Counselors/Psychologists   Karen Canada, LPC  & Michelle Van Horton    [phone]        [address]       [place], [place] [address]        Gary Bailey, CSW [phone] [address] [place], [place] [address]  Julia Tabor, LPC        [phone]        [address]      [place], [place] [address]        [place], [place] [phone] [address] [place], [place] [address]  Joanna Warren, LMFT       [phone]        [address]       [place], [place] [address]        Tina Thompson [phone] [address] [place], [place] [address]  Amanda Miller        [phone]        [address]       [place], [place] [address]        Bart McCormick [phone] [address] [place], [place] [address]  Cristin Saffo, PsyD       [phone]        [address]       [place], [place] [address]        Cheryl Lawson, LPC [phone] [address]  [place], [place] [address]   Courtney Jones        [phone]        [address]      [place], [place] [address]         Laura Ellington [facility] [phone] [email or web address]   Sation Konchella       [phone]        [address]       [place], [place] [address]        Carmen Bork [facility] [phone] [email or web address]   ADD/ADHD Testing:   Peter Lolli, PhD [address] [place], [place] [address] [phone] BCBS   [facility] in [place], [place]   Mary Heiney, MS LPA [address] [place], [place] [address] Phone: [phone] Fax: [phone] UMR , BCBS, Medicaid   Louise Lampron Welker [address] [place], [place] [address] [phone]    [facility] in [place], [place]   ----------------------------------------------------------   Neuropsych Testing Dr Kristine Herfkens [address] [place], [place] P: [phone] F: [phone] [email or web address]    [facility]  Liz Leopold [place] Therapist: [phone]     Eating Disorders   [facility]  [facility]  [email or web address] [phone]  Heather Kitchen [facility] [phone], ext [phone]   Substance Abuse Treatment   [facility], [place], [place] [facility] [place], [place] [facility] [place], [place], [place],  [facility], [place],  [place], [place], [place]   Charity Care Program:  [email or web address] 

## 2019-04-10 NOTE — Progress Notes (Signed)
Gynecology Annual Exam  PCP: Patient, No Pcp Per  Chief Complaint:  Chief Complaint  Patient presents with  . Gynecologic Exam    History of Present Illness: Patient is a 28 y.o. G3P0030 presenting for an annual exam.   LMP: Patient's last menstrual period was 04/05/2019. Average Interval: regular, 28 days Duration of flow: 2-4 days Heavy Menses: no Clots: no Intermenstrual Bleeding: no Postcoital Bleeding: no Dysmenorrhea: no  The patient is sexually active. She currently uses OCP (estrogen/progesterone) for contraception. She does not have dyspareunia.  The patient does perform self breast exams.  There is notable family history of breast cancer in her maternal aunt before age 48.  The patient wears seatbelts: yes.   The patient has regular exercise: yes, recently began walking at a nearby park 2-3 days/week.   Has made positive changes in her diet and cut out "junk food."  The patient reports current symptoms of depression.    Domestic violence screening is negative.  Review of Systems  Constitutional: Negative.   HENT: Negative.   Eyes: Negative.   Respiratory: Negative for shortness of breath and wheezing.   Cardiovascular: Negative for chest pain and palpitations.  Gastrointestinal: Negative.   Genitourinary: Positive for urgency. Negative for dysuria.  Musculoskeletal: Positive for joint pain.  Skin: Negative.   Neurological: Positive for headaches.  Endo/Heme/Allergies: Bruises/bleeds easily.  Psychiatric/Behavioral: Positive for depression. The patient is nervous/anxious.   All other systems reviewed and are negative.   Past Medical History:  Past Medical History:  Diagnosis Date  . BMI 50.0-59.9, adult ([facility])   . Habitual aborter     Past Surgical History:  History reviewed. No pertinent surgical history.  Gynecologic History:  Patient's last menstrual period was 04/05/2019. Contraception: OCP (estrogen/progesterone) Last Pap: June 2019.  Results were: NILM  Obstetric History: G3P0030  Family History:  Family History  Problem Relation Age of Onset  . Breast cancer Maternal Aunt 68  . Uterine cancer Maternal Aunt 17    Social History:  Social History   Socioeconomic History  . Marital status: Married    Spouse name: Not on file  . Number of children: Not on file  . Years of education: Not on file  . Highest education level: Not on file  Occupational History  . Not on file  Social Needs  . Financial resource strain: Not on file  . Food insecurity    Worry: Not on file    Inability: Not on file  . Transportation needs    Medical: Not on file    Non-medical: Not on file  Tobacco Use  . Smoking status: Former Smoker    Quit date: 09/25/2017    Years since quitting: 1.5  . Smokeless tobacco: Former Network engineer and Sexual Activity  . Alcohol use: No  . Drug use: No  . Sexual activity: Yes    Birth control/protection: None  Lifestyle  . Physical activity    Days per week: Not on file    Minutes per session: Not on file  . Stress: Not on file  Relationships  . Social Herbalist on phone: Not on file    Gets together: Not on file    Attends religious service: Not on file    Active member of club or organization: Not on file    Attends meetings of clubs or organizations: Not on file    Relationship status: Not on file  . Intimate partner violence  Fear of current or ex partner: Not on file    Emotionally abused: Not on file    Physically abused: Not on file    Forced sexual activity: Not on file  Other Topics Concern  . Not on file  Social History Narrative  . Not on file    Allergies:  Allergies  Allergen Reactions  . Nickel Rash    Medications: Prior to Admission medications   Medication Sig Start Date End Date Taking? Authorizing Provider  APRI 0.15-30 MG-MCG tablet Take 1 tablet by mouth daily. 01/10/19  Yes Copland, Ilona SorrelAlicia B, PA-C    Physical Exam Vitals: Blood  pressure 130/80, height 5' 5.5" (1.664 m), weight (!) 323 lb (146.5 kg), last menstrual period 04/05/2019.  General: NAD HEENT: normocephalic, anicteric Thyroid: no enlargement, no palpable nodules Pulmonary: No increased work of breathing, CTAB Cardiovascular: RRR, distal pulses 2+ Breast: Breasts symmetrical, no tenderness, no palpable nodules or masses, no skin or nipple retraction present, no nipple discharge.  No axillary or supraclavicular lymphadenopathy. Abdomen: Soft, non-tender, non-distended.  Umbilicus without lesions.  No hepatomegaly, splenomegaly or masses palpable. No evidence of hernia  Genitourinary:  External: Normal external female genitalia.  Normal urethral  meatus, normal Bartholin's and Skene's glands.    Vagina: Normal vaginal mucosa, no evidence of prolapse.    Cervix: Grossly normal in appearance, no bleeding  Uterus: Non-enlarged, mobile, normal contour.  No CMT  Adnexa: ovaries not palpable  Rectal: deferred  Lymphatic: no evidence of inguinal lymphadenopathy Extremities: no edema, erythema, or tenderness Neurologic: Grossly intact Psychiatric: mood appropriate, affect full  Assessment: 28 y.o. G3P0030 here for an annual exam  Plan: Problem List Items Addressed This Visit    None    Visit Diagnoses    Women's annual routine gynecological examination    -  Primary   Urinary urgency       Relevant Orders   Urine Culture   Pap smear for cervical cancer screening       Relevant Orders   Cytology - PAP   Routine screening for STI (sexually transmitted infection)       Relevant Orders   Cytology - PAP      1) STI screening was offered and accepted  2) ASCCP guidelines and rationale discussed.  Patient opts for every 3 year screening interval  3) Contraception - Currently taking OCP, but would like to conceive in the near future. Discussed returning for a visit with MD for workup due to history of three early miscarriages.  4) Ongoing problems with  anxiety and depression since childhood. Referral list given for therapists.  5) Discussed MyRisk screening for family hx of breast cancer, she is considering.  6) Follow up 1 year for an annual exam.  Marcelyn BruinsJacelyn Harles Evetts, CNM 04/10/2019  11:40 AM

## 2019-04-12 LAB — CYTOLOGY - PAP
Adequacy: ABSENT
Chlamydia: NEGATIVE
Diagnosis: NEGATIVE
Neisseria Gonorrhea: NEGATIVE
Trichomonas: NEGATIVE

## 2019-04-14 LAB — URINE CULTURE

## 2019-04-15 ENCOUNTER — Other Ambulatory Visit: Payer: Self-pay | Admitting: Maternal Newborn

## 2019-04-15 DIAGNOSIS — N390 Urinary tract infection, site not specified: Secondary | ICD-10-CM

## 2019-04-15 MED ORDER — SULFAMETHOXAZOLE-TRIMETHOPRIM 800-160 MG PO TABS: 1.0000 | ORAL_TABLET | Freq: Two times a day (BID) | ORAL | 0 refills | Status: AC

## 2019-04-15 NOTE — Progress Notes (Signed)
Rx for Bactrim to treat UTI.

## 2019-05-08 LAB — FETAL NONSTRESS TEST

## 2019-05-12 LAB — FETAL NONSTRESS TEST

## 2019-07-29 ENCOUNTER — Other Ambulatory Visit: Payer: Self-pay

## 2019-07-29 DIAGNOSIS — Z20822 Contact with and (suspected) exposure to covid-19: Secondary | ICD-10-CM

## 2019-07-30 LAB — NOVEL CORONAVIRUS, NAA: SARS-CoV-2, NAA: NOT DETECTED

## 2019-10-02 DIAGNOSIS — Z20828 Contact with and (suspected) exposure to other viral communicable diseases: Secondary | ICD-10-CM | POA: Diagnosis not present

## 2019-10-14 ENCOUNTER — Encounter: Payer: Self-pay | Admitting: Obstetrics and Gynecology

## 2019-10-14 NOTE — Telephone Encounter (Signed)
Sch NOB any OB

## 2019-10-14 NOTE — Telephone Encounter (Signed)
Last saw JS. Forwarding msg to you since I don't do this with OB. Thx.

## 2019-10-18 ENCOUNTER — Other Ambulatory Visit: Payer: Self-pay | Admitting: Obstetrics and Gynecology

## 2019-10-18 NOTE — Telephone Encounter (Signed)
She can have an appointment. Would be best to be made on Monday and then follow up Wednesday next week for labs 48 hours apart

## 2019-10-21 ENCOUNTER — Other Ambulatory Visit: Payer: BC Managed Care – PPO

## 2019-10-21 ENCOUNTER — Other Ambulatory Visit: Payer: Self-pay

## 2019-10-21 ENCOUNTER — Other Ambulatory Visit: Payer: Self-pay | Admitting: Obstetrics and Gynecology

## 2019-10-21 DIAGNOSIS — Z3490 Encounter for supervision of normal pregnancy, unspecified, unspecified trimester: Secondary | ICD-10-CM | POA: Diagnosis not present

## 2019-10-22 ENCOUNTER — Other Ambulatory Visit: Payer: Self-pay | Admitting: Obstetrics and Gynecology

## 2019-10-22 DIAGNOSIS — Z3490 Encounter for supervision of normal pregnancy, unspecified, unspecified trimester: Secondary | ICD-10-CM

## 2019-10-22 LAB — BETA HCG QUANT (REF LAB): hCG Quant: 13857 m[IU]/mL

## 2019-10-23 ENCOUNTER — Other Ambulatory Visit: Payer: Self-pay | Admitting: Obstetrics and Gynecology

## 2019-10-23 ENCOUNTER — Ambulatory Visit (INDEPENDENT_AMBULATORY_CARE_PROVIDER_SITE_OTHER): Payer: BC Managed Care – PPO

## 2019-10-23 ENCOUNTER — Ambulatory Visit (INDEPENDENT_AMBULATORY_CARE_PROVIDER_SITE_OTHER): Payer: BC Managed Care – PPO | Admitting: Obstetrics and Gynecology

## 2019-10-23 ENCOUNTER — Encounter: Payer: Self-pay | Admitting: Obstetrics and Gynecology

## 2019-10-23 ENCOUNTER — Other Ambulatory Visit: Payer: Self-pay

## 2019-10-23 VITALS — BP 152/98 | Ht 65.5 in | Wt 336.0 lb

## 2019-10-23 DIAGNOSIS — O99211 Obesity complicating pregnancy, first trimester: Secondary | ICD-10-CM

## 2019-10-23 DIAGNOSIS — R030 Elevated blood-pressure reading, without diagnosis of hypertension: Secondary | ICD-10-CM

## 2019-10-23 DIAGNOSIS — O09891 Supervision of other high risk pregnancies, first trimester: Secondary | ICD-10-CM

## 2019-10-23 DIAGNOSIS — Z3A01 Less than 8 weeks gestation of pregnancy: Secondary | ICD-10-CM

## 2019-10-23 DIAGNOSIS — Z3490 Encounter for supervision of normal pregnancy, unspecified, unspecified trimester: Secondary | ICD-10-CM

## 2019-10-23 DIAGNOSIS — Z3689 Encounter for other specified antenatal screening: Secondary | ICD-10-CM | POA: Diagnosis not present

## 2019-10-23 DIAGNOSIS — Z8759 Personal history of other complications of pregnancy, childbirth and the puerperium: Secondary | ICD-10-CM

## 2019-10-23 DIAGNOSIS — O2621 Pregnancy care for patient with recurrent pregnancy loss, first trimester: Secondary | ICD-10-CM

## 2019-10-23 DIAGNOSIS — Z3201 Encounter for pregnancy test, result positive: Secondary | ICD-10-CM

## 2019-10-26 NOTE — Progress Notes (Signed)
Patient ID: Kelly Stevens, female   DOB: 1990/11/23, 29 y.o.   MRN: [id number]  Reason for Consult: Follow-up (Viability scan today, some nausea )   Referred by No ref. provider found  Subjective:     HPI:  Kelly Stevens is a 29 y.o. female patient presents today for viability scan.  She had a beta-hCG earlier this week which was high and was scheduled today for a ultrasound.  Ultrasound showed a normal intrauterine pregnancy with a normal fetal heart rate.  She reports that she has been feeling well.  She denies any symptoms of vaginal bleeding or cramping.  She has been taking 400 mg of progesterone  vaginally every day as well as an over-the-counter at the supplement of chaste berry.  Past Medical History:  Diagnosis Date  . BMI 50.0-59.9, adult ([facility])   . Habitual aborter    Family History  Problem Relation Age of Onset  . Breast cancer Maternal Aunt 40  . Uterine cancer Maternal Aunt 34   History reviewed. No pertinent surgical history.  Short Social History:  Social History   Tobacco Use  . Smoking status: Former Smoker    Quit date: 09/25/2017    Years since quitting: 2.0  . Smokeless tobacco: Former Engineer, water Use Topics  . Alcohol use: No    Allergies  Allergen Reactions  . Nickel Rash    Current Outpatient Medications  Medication Sig Dispense Refill  . Chaste Tree (VITEX EXTRACT PO) Take by mouth.    . progesterone 200 MG SUPP      No current facility-administered medications for this visit.    Review of Systems  Constitutional: Negative for chills, fatigue, fever and unexpected weight change.  HENT: Negative for trouble swallowing.  Eyes: Negative for loss of vision.  Respiratory: Negative for cough, shortness of breath and wheezing.  Cardiovascular: Negative for chest pain, leg swelling, palpitations and syncope.  GI: Negative for abdominal pain, blood in stool, diarrhea, nausea and vomiting.  GU: Negative for difficulty urinating,  dysuria, frequency and hematuria.  Musculoskeletal: Negative for back pain, leg pain and joint pain.  Skin: Negative for rash.  Neurological: Negative for dizziness, headaches, light-headedness, numbness and seizures.  Psychiatric: Negative for behavioral problem, confusion, depressed mood and sleep disturbance.        Objective:  Objective   Vitals:   10/23/19 1048  BP: (!) 152/98  Weight: (!) 336 lb (152.4 kg)  Height: 5' 5.5" (1.664 m)   Body mass index is 55.06 kg/m.  Physical Exam Vitals and nursing note reviewed.  Constitutional:      Appearance: She is well-developed.  HENT:     Head: Normocephalic and atraumatic.  Eyes:     Pupils: Pupils are equal, round, and reactive to light.  Cardiovascular:     Rate and Rhythm: Normal rate and regular rhythm.  Pulmonary:     Effort: Pulmonary effort is normal. No respiratory distress.  Skin:    General: Skin is warm and dry.  Neurological:     Mental Status: She is alert and oriented to person, place, and time.  Psychiatric:        Behavior: Behavior normal.        Thought Content: Thought content normal.        Judgment: Judgment normal.        Assessment/Plan:    29 year old with history of recurrent miscarriages currently at 5 weeks 6 days gestation Recommended patient change to 200 mg of progesterone  vaginally before bed since this is the usual dosage.  Discussed that according to [employer] chaste berry supplementation can cause uterine contraction.  For this reason would recommend discontinuing this supplement.  We will follow up next week for viability and close care during first semester pregnancy to reduce maternal stress.  Has new OB appointment scheduled for 2 weeks from now.  Patient does have elevated blood pressure today will need to repeat at next visit, likely needs treatment for this.  More than 15 minutes were spent face to face with the patient in the room with more than 50% of the time spent providing  counseling and discussing the plan of management.      Adrian Prows MD [facility], [facility] 10/28/2019 8:40 AM

## 2019-10-30 ENCOUNTER — Encounter: Payer: Self-pay | Admitting: Obstetrics and Gynecology

## 2019-10-30 ENCOUNTER — Other Ambulatory Visit: Payer: Self-pay | Admitting: Obstetrics and Gynecology

## 2019-10-30 ENCOUNTER — Other Ambulatory Visit: Payer: Self-pay

## 2019-10-30 ENCOUNTER — Ambulatory Visit (INDEPENDENT_AMBULATORY_CARE_PROVIDER_SITE_OTHER): Payer: BC Managed Care – PPO | Admitting: Obstetrics and Gynecology

## 2019-10-30 ENCOUNTER — Other Ambulatory Visit (INDEPENDENT_AMBULATORY_CARE_PROVIDER_SITE_OTHER): Payer: BC Managed Care – PPO

## 2019-10-30 VITALS — BP 130/92 | Ht 66.0 in | Wt 337.0 lb

## 2019-10-30 DIAGNOSIS — O2621 Pregnancy care for patient with recurrent pregnancy loss, first trimester: Secondary | ICD-10-CM

## 2019-10-30 DIAGNOSIS — Z3A01 Less than 8 weeks gestation of pregnancy: Secondary | ICD-10-CM

## 2019-10-30 DIAGNOSIS — Z3689 Encounter for other specified antenatal screening: Secondary | ICD-10-CM | POA: Diagnosis not present

## 2019-10-30 DIAGNOSIS — O3680X0 Pregnancy with inconclusive fetal viability, not applicable or unspecified: Secondary | ICD-10-CM | POA: Diagnosis not present

## 2019-10-30 DIAGNOSIS — Z3490 Encounter for supervision of normal pregnancy, unspecified, unspecified trimester: Secondary | ICD-10-CM

## 2019-10-30 DIAGNOSIS — O99211 Obesity complicating pregnancy, first trimester: Secondary | ICD-10-CM

## 2019-10-30 DIAGNOSIS — Z6841 Body Mass Index (BMI) 40.0 and over, adult: Secondary | ICD-10-CM

## 2019-10-30 DIAGNOSIS — O09891 Supervision of other high risk pregnancies, first trimester: Secondary | ICD-10-CM

## 2019-10-30 DIAGNOSIS — Z8759 Personal history of other complications of pregnancy, childbirth and the puerperium: Secondary | ICD-10-CM

## 2019-10-30 NOTE — Progress Notes (Signed)
Patient ID: Kelly Stevens, female   DOB: 1991-06-02, 29 y.o.   MRN: [id number]  Reason for Consult: Follow-up (ROB/US)   Referred by Gilman Schmidt, Marely Apgar R, *  Subjective:     HPI:  Kelly Stevens is a 29 y.o. female she is following up today for weekly visits because of her history of recurrent pregnancy loss.  She reports that she has been feeling well.  She continues to have symptoms of pregnancy such as nausea.  She denies any vaginal bleeding or cramping  Past Medical History:  Diagnosis Date  . BMI 50.0-59.9, adult ([place])   . Habitual aborter    Family History  Problem Relation Age of Onset  . Breast cancer Maternal Aunt 71  . Uterine cancer Maternal Aunt 34   History reviewed. No pertinent surgical history.  Short Social History:  Social History   Tobacco Use  . Smoking status: Former Smoker    Quit date: 09/25/2017    Years since quitting: 2.0  . Smokeless tobacco: Former Network engineer Use Topics  . Alcohol use: No    Allergies  Allergen Reactions  . Nickel Rash    Current Outpatient Medications  Medication Sig Dispense Refill  . Chaste Tree (VITEX EXTRACT PO) Take by mouth.    . progesterone 200 MG SUPP      No current facility-administered medications for this visit.    Review of Systems  Constitutional: Negative for chills, fatigue, fever and unexpected weight change.  HENT: Negative for trouble swallowing.  Eyes: Negative for loss of vision.  Respiratory: Negative for cough, shortness of breath and wheezing.  Cardiovascular: Negative for chest pain, leg swelling, palpitations and syncope.  GI: Negative for abdominal pain, blood in stool, diarrhea, nausea and vomiting.  GU: Negative for difficulty urinating, dysuria, frequency and hematuria.  Musculoskeletal: Negative for back pain, leg pain and joint pain.  Skin: Negative for rash.  Neurological: Negative for dizziness, headaches, light-headedness, numbness and seizures.  Psychiatric:  Negative for behavioral problem, confusion, depressed mood and sleep disturbance.        Objective:  Objective   Vitals:   10/30/19 1145  BP: (!) 130/92  Weight: (!) 337 lb (152.9 kg)  Height: 5\' 6"  (1.676 m)   Body mass index is 54.39 kg/m.  Physical Exam Vitals and nursing note reviewed.  Constitutional:      Appearance: She is well-developed.  HENT:     Head: Normocephalic and atraumatic.  Eyes:     Pupils: Pupils are equal, round, and reactive to light.  Cardiovascular:     Rate and Rhythm: Normal rate and regular rhythm.  Pulmonary:     Effort: Pulmonary effort is normal. No respiratory distress.  Skin:    General: Skin is warm and dry.  Neurological:     Mental Status: She is alert and oriented to person, place, and time.  Psychiatric:        Behavior: Behavior normal.        Thought Content: Thought content normal.        Judgment: Judgment normal.        Assessment/Plan:    29 yo [id number] at 7 weeks 1 day by LMP Ultrasound today measurements of gestational sac were consistent with her gestational age however crown-rump length and fetal heart rate were not identified, cannot definitively say that this is a miscarriage at this time given her very early gestational age and lack of symptoms.  Will follow up next week.  Continue vaginal  progesterone.  More than 15 minutes were spent face to face with the patient in the room with more than 50% of the time spent providing counseling and discussing the plan of management.      Adelene Idler MD [facility], [facility] 10/30/2019 5:20 PM

## 2019-11-01 ENCOUNTER — Other Ambulatory Visit: Payer: Self-pay | Admitting: Obstetrics and Gynecology

## 2019-11-01 DIAGNOSIS — Z3490 Encounter for supervision of normal pregnancy, unspecified, unspecified trimester: Secondary | ICD-10-CM

## 2019-11-01 DIAGNOSIS — O3680X0 Pregnancy with inconclusive fetal viability, not applicable or unspecified: Secondary | ICD-10-CM

## 2019-11-01 DIAGNOSIS — Z8759 Personal history of other complications of pregnancy, childbirth and the puerperium: Secondary | ICD-10-CM

## 2019-11-01 NOTE — Telephone Encounter (Signed)
Can you call her and get her scheduled [facility] an ultrasound [facility]? Please reschedule Monday visit for after [place].  Will order. Thank you.  Dr. Jerene Pitch

## 2019-11-01 NOTE — Telephone Encounter (Signed)
Patient is aware of location date and time. Patient advise to drink 32 oz one hour prior to ultrasound to arrive with a full bladder.

## 2019-11-04 ENCOUNTER — Ambulatory Visit
Admission: RE | Admit: 2019-11-04 | Discharge: 2019-11-04 | Disposition: A | Discharge status: Home / Self Care | Payer: BC Managed Care – PPO | Source: Ambulatory Visit | Attending: Obstetrics and Gynecology | Admitting: Obstetrics and Gynecology

## 2019-11-04 ENCOUNTER — Ambulatory Visit (INDEPENDENT_AMBULATORY_CARE_PROVIDER_SITE_OTHER): Payer: BC Managed Care – PPO | Admitting: Obstetrics and Gynecology

## 2019-11-04 ENCOUNTER — Encounter: Payer: Self-pay | Admitting: Obstetrics and Gynecology

## 2019-11-04 ENCOUNTER — Other Ambulatory Visit (HOSPITAL_COMMUNITY)
Admission: RE | Admit: 2019-11-04 | Discharge: 2019-11-04 | Disposition: A | Discharge status: Home / Self Care | Payer: BC Managed Care – PPO | Source: Ambulatory Visit | Attending: Obstetrics and Gynecology | Admitting: Obstetrics and Gynecology

## 2019-11-04 ENCOUNTER — Other Ambulatory Visit: Payer: Self-pay

## 2019-11-04 VITALS — BP 138/90 | Ht 66.0 in | Wt 336.0 lb

## 2019-11-04 DIAGNOSIS — Z3481 Encounter for supervision of other normal pregnancy, first trimester: Secondary | ICD-10-CM

## 2019-11-04 DIAGNOSIS — Z3490 Encounter for supervision of normal pregnancy, unspecified, unspecified trimester: Secondary | ICD-10-CM | POA: Diagnosis not present

## 2019-11-04 DIAGNOSIS — O99210 Obesity complicating pregnancy, unspecified trimester: Secondary | ICD-10-CM | POA: Insufficient documentation

## 2019-11-04 DIAGNOSIS — O10919 Unspecified pre-existing hypertension complicating pregnancy, unspecified trimester: Secondary | ICD-10-CM | POA: Insufficient documentation

## 2019-11-04 DIAGNOSIS — O161 Unspecified maternal hypertension, first trimester: Secondary | ICD-10-CM

## 2019-11-04 DIAGNOSIS — O169 Unspecified maternal hypertension, unspecified trimester: Secondary | ICD-10-CM

## 2019-11-04 DIAGNOSIS — O09891 Supervision of other high risk pregnancies, first trimester: Secondary | ICD-10-CM

## 2019-11-04 DIAGNOSIS — Z23 Encounter for immunization: Secondary | ICD-10-CM | POA: Diagnosis not present

## 2019-11-04 DIAGNOSIS — Z130 Encounter for screening for diseases of the blood and blood-forming organs and certain disorders involving the immune mechanism: Secondary | ICD-10-CM | POA: Diagnosis not present

## 2019-11-04 DIAGNOSIS — O3680X0 Pregnancy with inconclusive fetal viability, not applicable or unspecified: Secondary | ICD-10-CM | POA: Diagnosis not present

## 2019-11-04 DIAGNOSIS — Z8759 Personal history of other complications of pregnancy, childbirth and the puerperium: Secondary | ICD-10-CM | POA: Diagnosis not present

## 2019-11-04 DIAGNOSIS — Z3A01 Less than 8 weeks gestation of pregnancy: Secondary | ICD-10-CM | POA: Diagnosis not present

## 2019-11-04 DIAGNOSIS — O99211 Obesity complicating pregnancy, first trimester: Secondary | ICD-10-CM

## 2019-11-04 DIAGNOSIS — O0990 Supervision of high risk pregnancy, unspecified, unspecified trimester: Secondary | ICD-10-CM | POA: Insufficient documentation

## 2019-11-04 MED ORDER — PROGESTERONE 200 MG VA SUPP: 200.0000 mg | Freq: Every day | VAGINAL | 3 refills | Status: DC

## 2019-11-04 NOTE — Patient Instructions (Addendum)
Initial steps to help :   B6 (pyridoxine) 25 mg,  3-4 times a day Unisom (doxylamine) 25 mg at bedtime **B6 and Unisom are available as a combination prescription medications called diclegis and bonjesta  B1 (thiamin)  50-100 mg 1-4 a day  Continue prenatal vitamin with iron and thiamin. If it is not tolerated switch to 1 mg of folic acid.  Can add medication for gastric reflux if needed.  Subsequent steps to be added to B1, B6, and Unisom:  1. Antihistamine (one of the following medications) Dramamine      25-50 mg every 4-6 hours Benadryl      25-50 mg every 4-6 hours Meclizine      25 mg every 6 hours  2. Dopamine Antagonist (one of the following medications) Metoclopramide  (Reglan)  5-10 mg every 6-8 hours         PO Promethazine   (Phenergan)   12.5-25 mg every 4-6 hours      PO or rectal Prochlorperazine  (Compazine)  5-10 mg every 6-8 hours     25mg BID rectally    Subsequent steps if there has still not been improvement in symptoms:  3. Daily stool softner  4. Ondansetron  (Zofran)   4-8 mg every 6-8 hours     First Trimester of Pregnancy The first trimester of pregnancy is from week 1 until the end of week 13 (months 1 through 3). A week after a sperm fertilizes an egg, the egg will implant on the wall of the uterus. This embryo will begin to develop into a baby. Genes from you and your partner will form the baby. The female genes will determine whether the baby will be a boy or a girl. At 6-8 weeks, the eyes and face will be formed, and the heartbeat can be seen on ultrasound. At the end of 12 weeks, all the baby's organs will be formed. Now that you are pregnant, you will want to do everything you can to have a healthy baby. Two of the most important things are to get good prenatal care and to follow your health care provider's instructions. Prenatal care is all the medical care you receive before the baby's birth. This care will help prevent, find, and treat any problems  during the pregnancy and childbirth. Body changes during your first trimester Your body goes through many changes during pregnancy. The changes vary from woman to woman.  You may gain or lose a couple of pounds at first.  You may feel sick to your stomach (nauseous) and you may throw up (vomit). If the vomiting is uncontrollable, call your health care provider.  You may tire easily.  You may develop headaches that can be relieved by medicines. All medicines should be approved by your health care provider.  You may urinate more often. Painful urination may mean you have a bladder infection.  You may develop heartburn as a result of your pregnancy.  You may develop constipation because certain hormones are causing the muscles that push stool through your intestines to slow down.  You may develop hemorrhoids or swollen veins (varicose veins).  Your breasts may begin to grow larger and become tender. Your nipples may stick out more, and the tissue that surrounds them (areola) may become darker.  Your gums may bleed and may be sensitive to brushing and flossing.  Dark spots or blotches (chloasma, mask of pregnancy) may develop on your face. This will likely fade after the baby is born.    Your menstrual periods will stop.  You may have a loss of appetite.  You may develop cravings for certain kinds of food.  You may have changes in your emotions from day to day, such as being excited to be pregnant or being concerned that something may go wrong with the pregnancy and baby.  You may have more vivid and strange dreams.  You may have changes in your hair. These can include thickening of your hair, rapid growth, and changes in texture. Some women also have hair loss during or after pregnancy, or hair that feels dry or thin. Your hair will most likely return to normal after your baby is born. What to expect at prenatal visits During a routine prenatal visit:  You will be weighed to make  sure you and the baby are growing normally.  Your blood pressure will be taken.  Your abdomen will be measured to track your baby's growth.  The fetal heartbeat will be listened to between weeks 10 and 14 of your pregnancy.  Test results from any previous visits will be discussed. Your health care provider may ask you:  How you are feeling.  If you are feeling the baby move.  If you have had any abnormal symptoms, such as leaking fluid, bleeding, severe headaches, or abdominal cramping.  If you are using any tobacco products, including cigarettes, chewing tobacco, and electronic cigarettes.  If you have any questions. Other tests that may be performed during your first trimester include:  Blood tests to find your blood type and to check for the presence of any previous infections. The tests will also be used to check for low iron levels (anemia) and protein on red blood cells (Rh antibodies). Depending on your risk factors, or if you previously had diabetes during pregnancy, you may have tests to check for high blood sugar that affects pregnant women (gestational diabetes).  Urine tests to check for infections, diabetes, or protein in the urine.  An ultrasound to confirm the proper growth and development of the baby.  Fetal screens for spinal cord problems (spina bifida) and Down syndrome.  HIV (human immunodeficiency virus) testing. Routine prenatal testing includes screening for HIV, unless you choose not to have this test.  You may need other tests to make sure you and the baby are doing well. Follow these instructions at home: Medicines  Follow your health care provider's instructions regarding medicine use. Specific medicines may be either safe or unsafe to take during pregnancy.  Take a prenatal vitamin that contains at least 600 micrograms (mcg) of folic acid.  If you develop constipation, try taking a stool softener if your health care provider approves. Eating and  drinking   Eat a balanced diet that includes fresh fruits and vegetables, whole grains, good sources of protein such as meat, eggs, or tofu, and low-fat dairy. Your health care provider will help you determine the amount of weight gain that is right for you.  Avoid raw meat and uncooked cheese. These carry germs that can cause birth defects in the baby.  Eating four or five small meals rather than three large meals a day may help relieve nausea and vomiting. If you start to feel nauseous, eating a few soda crackers can be helpful. Drinking liquids between meals, instead of during meals, also seems to help ease nausea and vomiting.  Limit foods that are high in fat and processed sugars, such as fried and sweet foods.  To prevent constipation: ? Eat foods that  are high in fiber, such as fresh fruits and vegetables, whole grains, and beans. ? Drink enough fluid to keep your urine clear or pale yellow. Activity  Exercise only as directed by your health care provider. Most women can continue their usual exercise routine during pregnancy. Try to exercise for 30 minutes at least 5 days a week. Exercising will help you: ? Control your weight. ? Stay in shape. ? Be prepared for labor and delivery.  Experiencing pain or cramping in the lower abdomen or lower back is a good sign that you should stop exercising. Check with your health care provider before continuing with normal exercises.  Try to avoid standing for long periods of time. Move your legs often if you must stand in one place for a long time.  Avoid heavy lifting.  Wear low-heeled shoes and practice good posture.  You may continue to have sex unless your health care provider tells you not to. Relieving pain and discomfort  Wear a good support bra to relieve breast tenderness.  Take warm sitz baths to soothe any pain or discomfort caused by hemorrhoids. Use hemorrhoid cream if your health care provider approves.  Rest with your  legs elevated if you have leg cramps or low back pain.  If you develop varicose veins in your legs, wear support hose. Elevate your feet for 15 minutes, 3-4 times a day. Limit salt in your diet. Prenatal care  Schedule your prenatal visits by the twelfth week of pregnancy. They are usually scheduled monthly at first, then more often in the last 2 months before delivery.  Write down your questions. Take them to your prenatal visits.  Keep all your prenatal visits as told by your health care provider. This is important. Safety  Wear your seat belt at all times when driving.  Make a list of emergency phone numbers, including numbers for family, friends, the hospital, and police and fire departments. General instructions  Ask your health care provider for a referral to a local prenatal education class. Begin classes no later than the beginning of month 6 of your pregnancy.  Ask for help if you have counseling or nutritional needs during pregnancy. Your health care provider can offer advice or refer you to specialists for help with various needs.  Do not use hot tubs, steam rooms, or saunas.  Do not douche or use tampons or scented sanitary pads.  Do not cross your legs for long periods of time.  Avoid cat litter boxes and soil used by cats. These carry germs that can cause birth defects in the baby and possibly loss of the fetus by miscarriage or stillbirth.  Avoid all smoking, herbs, alcohol, and medicines not prescribed by your health care provider. Chemicals in these products affect the formation and growth of the baby.  Do not use any products that contain nicotine or tobacco, such as cigarettes and e-cigarettes. If you need help quitting, ask your health care provider. You may receive counseling support and other resources to help you quit.  Schedule a dentist appointment. At home, brush your teeth with a soft toothbrush and be gentle when you floss. Contact a health care provider  if:  You have dizziness.  You have mild pelvic cramps, pelvic pressure, or nagging pain in the abdominal area.  You have persistent nausea, vomiting, or diarrhea.  You have a bad smelling vaginal discharge.  You have pain when you urinate.  You notice increased swelling in your face, hands, legs, or ankles.    You are exposed to fifth disease or chickenpox.  You are exposed to [place] measles (rubella) and have never had it. Get help right away if:  You have a fever.  You are leaking fluid from your vagina.  You have spotting or bleeding from your vagina.  You have severe abdominal cramping or pain.  You have rapid weight gain or loss.  You vomit blood or material that looks like coffee grounds.  You develop a severe headache.  You have shortness of breath.  You have any kind of trauma, such as from a fall or a car accident. Summary  The first trimester of pregnancy is from week 1 until the end of week 13 (months 1 through 3).  Your body goes through many changes during pregnancy. The changes vary from woman to woman.  You will have routine prenatal visits. During those visits, your health care provider will examine you, discuss any test results you may have, and talk with you about how you are feeling. This information is not intended to replace advice given to you by your health care provider. Make sure you discuss any questions you have with your health care provider. Document Revised: 07/28/2017 Document Reviewed: 07/27/2016 Elsevier Patient Education  2020 [employer].

## 2019-11-04 NOTE — Progress Notes (Signed)
NOB/US at hospital C/o some nausea and fatigue Denies cramping/spotting

## 2019-11-04 NOTE — Progress Notes (Signed)
11/04/2019   Chief Complaint: Missed period  Transfer of Care Patient: no  History of Present Illness: Kelly Stevens is a 29 y.o. G4P0030 [redacted]w[redacted]d based on Patient's last menstrual period was 09/10/2019 (exact date). with an Estimated Date of Delivery: 06/18/20, with the above CC.   Her periods were: regular periods every 28 days She was using no method when she conceived.  She has Positive signs or symptoms of nausea/vomiting of pregnancy. She has Negative signs or symptoms of miscarriage or preterm labor She was taking different medications around the time she conceived/early pregnancy. Taking vitex supplement which she has discontinued now. Since her LMP, she has not used alcohol Since her LMP, she has not used tobacco products Since her LMP, she has not used illegal drugs.   Current or past history of domestic violence. no  Infection History:  1. Since her LMP, she has not had a viral illness.  2. She denies close contact with children on a regular basis     3. She admits to a history of chicken pox. She reports vaccination for chicken pox in the past. 4. Patient or partner has history of genital herpes  no 5. History of STI (GC, CT, HPV, syphilis, HIV)  no   6.  She does not live with someone with TB or TB exposed. 7. History of recent travel :  no 8. She identifies Negative Zika risk factors for her and her partner 84. There are not cats in the home in the home.  She understands that while pregnant she should not change cat litter.   Genetic Screening Questions: (Includes patient, baby's father, or anyone in either family)   1. Patient's age >/= 3 [facility]  no 2. Thalassemia ([place], [place], Mediterranean, or Asian background): MCV<80 - maybe has been evaluated for thalassemia before.  3. Neural tube defect (meningomyelocele, spina bifida, anencephaly)  no 4. Congenital heart defect  no  5. Down syndrome  no 6. Tay-Sachs (Jewish, [place]  no 7. Canavan's Disease  no 8.  Sickle cell disease or trait (African)  no  9. Hemophilia or other blood disorders  no  10. Muscular dystrophy  no  11. Cystic fibrosis  no  12. Huntington's Chorea  no  13. Mental retardation/autism  no 14. Other inherited genetic or chromosomal disorder  no 15. Maternal metabolic disorder (DM, PKU, etc)  no 16. Patient or FOB with a child with a birth defect not listed above no  16a. Patient or FOB with a birth defect themselves no 17. Recurrent pregnancy loss, or stillbirth  yes  18. Any medications since LMP other than prenatal vitamins (include vitamins, supplements, OTC meds, drugs, alcohol)  Yes: taking vitex, but now discontinued 19. Any other genetic/environmental exposure to discuss  no  ROS:  Review of Systems  Constitutional: Negative for chills, fever, malaise/fatigue and weight loss.  HENT: Negative for congestion, hearing loss and sinus pain.   Eyes: Negative for blurred vision and double vision.  Respiratory: Negative for cough, sputum production, shortness of breath and wheezing.   Cardiovascular: Negative for chest pain, palpitations, orthopnea and leg swelling.  Gastrointestinal: Negative for abdominal pain, constipation, diarrhea, nausea and vomiting.  Genitourinary: Negative for dysuria, flank pain, frequency, hematuria and urgency.  Musculoskeletal: Negative for back pain, falls and joint pain.  Skin: Negative for itching and rash.  Neurological: Negative for dizziness and headaches.  Psychiatric/Behavioral: Negative for depression, substance abuse and suicidal ideas. The patient is not nervous/anxious.  OBGYN History: As per HPI. OB History  Gravida Para Term Preterm AB Living  4       3    SAB TAB Ectopic Multiple Live Births  3            # Outcome Date GA Lbr Len/2nd Weight Sex Delivery Anes PTL Lv  4 Current           3 SAB 12/11/17 [redacted]w[redacted]d         2 SAB 08/29/17          1 SAB             Any issues with any prior pregnancies: no Any prior  children are healthy, doing well, without any problems or issues: not applicable Last pap smear NIL 2020 History of STIs: No   Past Medical History: Past Medical History:  Diagnosis Date  . BMI 50.0-59.9, adult ([place])   . Habitual aborter     Past Surgical History: No past surgical history on file.  Family History:  Family History  Problem Relation Age of Onset  . Breast cancer Maternal Aunt 32  . Uterine cancer Maternal Aunt 52   She denies any female cancers, bleeding or blood clotting disorders.   Social History:  Social History   Socioeconomic History  . Marital status: Married    Spouse name: Not on file  . Number of children: Not on file  . Years of education: Not on file  . Highest education level: Not on file  Occupational History  . Not on file  Tobacco Use  . Smoking status: Former Smoker    Quit date: 09/25/2017    Years since quitting: 2.1  . Smokeless tobacco: Former Network engineer and Sexual Activity  . Alcohol use: No  . Drug use: No  . Sexual activity: Yes    Birth control/protection: None  Other Topics Concern  . Not on file  Social History Narrative  . Not on file   Social Determinants of Health   Financial Resource Strain:   . Difficulty of Paying Living Expenses: Not on file  Food Insecurity:   . Worried About Charity fundraiser in the Last Year: Not on file  . Ran Out of Food in the Last Year: Not on file  Transportation Needs:   . Lack of Transportation (Medical): Not on file  . Lack of Transportation (Non-Medical): Not on file  Physical Activity:   . Days of Exercise per Week: Not on file  . Minutes of Exercise per Session: Not on file  Stress:   . Feeling of Stress : Not on file  Social Connections:   . Frequency of Communication with Friends and Family: Not on file  . Frequency of Social Gatherings with Friends and Family: Not on file  . Attends Religious Services: Not on file  . Active Member of Clubs or Organizations: Not  on file  . Attends Archivist Meetings: Not on file  . Marital Status: Not on file  Intimate Partner Violence:   . Fear of Current or Ex-Partner: Not on file  . Emotionally Abused: Not on file  . Physically Abused: Not on file  . Sexually Abused: Not on file    Allergy: Allergies  Allergen Reactions  . Nickel Rash    Current Outpatient Medications:  Current Outpatient Medications:  .  Chaste Tree (VITEX EXTRACT PO), Take by mouth., Disp: , Rfl:  .  progesterone 200 MG SUPP, , Disp: ,  Rfl:    Physical Exam: Physical Exam  Constitutional: She is oriented to person, place, and time and well-developed, well-nourished, and in no distress.  HENT:  Head: Normocephalic and atraumatic.  Eyes: Pupils are equal, round, and reactive to light.  Neck: No thyromegaly present.  Cardiovascular: Normal rate and regular rhythm.  Pulmonary/Chest: Effort normal.  Abdominal: Soft. Bowel sounds are normal. She exhibits no distension. There is no abdominal tenderness. There is no rebound and no guarding.  Musculoskeletal:        General: Normal range of motion.     Cervical back: Normal range of motion and neck supple.  Neurological: She is alert and oriented to person, place, and time.  Skin: Skin is warm and dry.  Psychiatric: Affect and judgment normal.  Nursing note and vitals reviewed.   Assessment: Ms. Lybrand is a 29 y.o. G4P0030 [redacted]w[redacted]d based on Patient's last menstrual period was 09/10/2019 (exact date). with an Estimated Date of Delivery: 06/18/20,  for prenatal care.  Plan:  1) Avoid alcoholic beverages. 2) Patient encouraged not to smoke.  3) Discontinue the use of all non-medicinal drugs and chemicals.  4) Take prenatal vitamins daily.  5) Seatbelt use advised 6) Nutrition, food safety (fish, cheese advisories, and high nitrite foods) and exercise discussed. 7) Hospital and practice style delivering at [facility] discussed  8) Patient is asked about travel to areas at risk  for the Zika virus, and counseled to avoid travel and exposure to mosquitoes or sexual partners who may have themselves been exposed to the virus. Testing is discussed, and will be ordered as appropriate.  9) Childbirth classes at [facility] advised 10) Genetic Screening, such as with 1st Trimester Screening, cell free fetal DNA, AFP testing, and Ultrasound, as well as with amniocentesis and CVS as appropriate, is discussed with patient. She plans to have genetic testing this pregnancy.  Continue progesterone supplementation until [redacted] wks gestation 1GTT planned for next visit.  Elevated BP readings today and at previous visits. Will recheck in 1 week, consider medication if needed.  81 mg ASA initiation at 12 weeks recommended.  Problem list reviewed and updated.  I discussed the assessment and treatment plan with the patient. The patient was provided an opportunity to ask questions and all were answered. The patient agreed with the plan and demonstrated an understanding of the instructions.  Adelene Idler MD [facility], [facility] 11/04/2019 9:59 AM

## 2019-11-05 LAB — RPR+RH+ABO+RUB AB+AB SCR+CB...
Antibody Screen: NEGATIVE
HIV Screen 4th Generation wRfx: NONREACTIVE
Hematocrit: 39.7 % (ref 34.0–46.6)
Hemoglobin: 12 g/dL (ref 11.1–15.9)
Hepatitis B Surface Ag: NEGATIVE
MCH: 23.4 pg — ABNORMAL LOW (ref 26.6–33.0)
MCHC: 30.2 g/dL — ABNORMAL LOW (ref 31.5–35.7)
MCV: 78 fL — ABNORMAL LOW (ref 79–97)
Platelets: 385 10*3/uL (ref 150–450)
RBC: 5.12 x10E6/uL (ref 3.77–5.28)
RDW: 15.5 % — ABNORMAL HIGH (ref 11.7–15.4)
RPR Ser Ql: NONREACTIVE
Rh Factor: POSITIVE
Rubella Antibodies, IGG: <0.9 index — ABNORMAL LOW (ref >0.99)
Varicella zoster IgG: 489 index (ref >165)
WBC: 8 10*3/uL (ref 3.4–10.8)

## 2019-11-05 LAB — MONITOR DRUG PROFILE 10(MW)
Amphetamine Scrn, Ur: NEGATIVE ng/mL
BARBITURATE SCREEN URINE: NEGATIVE ng/mL
BENZODIAZEPINE SCREEN, URINE: NEGATIVE ng/mL
CANNABINOIDS UR QL SCN: NEGATIVE ng/mL
Cocaine (Metab) Scrn, Ur: NEGATIVE ng/mL
Creatinine(Crt), U: 74.9 mg/dL (ref 20.0–300.0)
Methadone Screen, Urine: NEGATIVE ng/mL
OXYCODONE+OXYMORPHONE UR QL SCN: NEGATIVE ng/mL
Opiate Scrn, Ur: NEGATIVE ng/mL
Ph of Urine: 5.8 (ref 4.5–8.9)
Phencyclidine Qn, Ur: NEGATIVE ng/mL
Propoxyphene Scrn, Ur: NEGATIVE ng/mL

## 2019-11-05 LAB — PROTEIN / CREATININE RATIO, URINE
Creatinine, Urine: 65.5 mg/dL
Protein, Ur: 4.5 mg/dL
Protein/Creat Ratio: 69 mg/g creat (ref 0–200)

## 2019-11-05 LAB — HEPATITIS C ANTIBODY: Hep C Virus Ab: <0.1 s/co ratio (ref 0.0–0.9)

## 2019-11-06 LAB — URINE CYTOLOGY ANCILLARY ONLY
Chlamydia: NEGATIVE
Comment: NEGATIVE
Comment: NORMAL
Neisseria Gonorrhea: NEGATIVE

## 2019-11-06 LAB — URINE CULTURE: Organism ID, Bacteria: NO GROWTH

## 2019-11-07 LAB — HGB FRACTIONATION CASCADE
Hgb A2: 2.2 % (ref 1.8–3.2)
Hgb A: 97.8 % (ref 96.4–98.8)
Hgb F: 0 % (ref 0.0–2.0)
Hgb S: 0 %

## 2019-11-11 ENCOUNTER — Encounter: Payer: Self-pay | Admitting: Obstetrics and Gynecology

## 2019-11-11 ENCOUNTER — Other Ambulatory Visit: Payer: Self-pay

## 2019-11-11 ENCOUNTER — Ambulatory Visit (INDEPENDENT_AMBULATORY_CARE_PROVIDER_SITE_OTHER): Payer: BC Managed Care – PPO | Admitting: Obstetrics and Gynecology

## 2019-11-11 VITALS — BP 144/90 | Wt 339.0 lb

## 2019-11-11 DIAGNOSIS — O10911 Unspecified pre-existing hypertension complicating pregnancy, first trimester: Secondary | ICD-10-CM

## 2019-11-11 DIAGNOSIS — O10919 Unspecified pre-existing hypertension complicating pregnancy, unspecified trimester: Secondary | ICD-10-CM

## 2019-11-11 DIAGNOSIS — O99211 Obesity complicating pregnancy, first trimester: Secondary | ICD-10-CM

## 2019-11-11 DIAGNOSIS — Z3A08 8 weeks gestation of pregnancy: Secondary | ICD-10-CM

## 2019-11-11 DIAGNOSIS — Z6841 Body Mass Index (BMI) 40.0 and over, adult: Secondary | ICD-10-CM | POA: Insufficient documentation

## 2019-11-11 DIAGNOSIS — O99210 Obesity complicating pregnancy, unspecified trimester: Secondary | ICD-10-CM

## 2019-11-11 DIAGNOSIS — O0991 Supervision of high risk pregnancy, unspecified, first trimester: Secondary | ICD-10-CM

## 2019-11-11 NOTE — Progress Notes (Signed)
Routine Prenatal Care Visit  Subjective  Kelly Stevens is a 29 y.o. G4P0030 at [redacted]w[redacted]d being seen today for ongoing prenatal care.  She is currently monitored for the following issues for this high-risk pregnancy and has Family history of breast cancer; Supervision of high-risk pregnancy; Obesity in pregnancy, antepartum; Chronic hypertension affecting pregnancy; and BMI 50.0-59.9, adult ([place]) on their problem list.  ----------------------------------------------------------------------------------- Patient reports no complaints.    . Vag. Bleeding: None.  Movement: Absent. Leaking Fluid denies.  ----------------------------------------------------------------------------------- The following portions of the patient's history were reviewed and updated as appropriate: allergies, current medications, past family history, past medical history, past social history, past surgical history and problem list. Problem list updated.  Objective  Blood pressure (!) 144/90, weight (!) 339 lb (153.8 kg), last menstrual period 09/10/2019. Pregravid weight 336 lb (152.4 kg) Total Weight Gain 3 lb (1.361 kg) Urinalysis: Urine Protein    Urine Glucose    Fetal Status: Fetal Heart Rate (bpm): 165   Movement: Absent     General:  Alert, oriented and cooperative. Patient is in no acute distress.  Skin: Skin is warm and dry. No rash noted.   Cardiovascular: Normal heart rate noted  Respiratory: Normal respiratory effort, no problems with respiration noted  Abdomen: Soft, gravid, appropriate for gestational age. Pain/Pressure: Absent     Pelvic:  Cervical exam deferred        Extremities: Normal range of motion.     Mental Status: Normal mood and affect. Normal behavior. Normal judgment and thought content.   Assessment   28 y.o. G4P0030 at [redacted]w[redacted]d by  06/16/2020, by Last Menstrual Period presenting for routine prenatal visit  Plan   Pregnancy #4 Problems (from 11/04/19 to present)    Problem Noted Resolved    BMI 50.0-59.9, adult ([place]) 11/11/2019 by Will Bonnet, MD No   Supervision of high-risk pregnancy 11/04/2019 by Homero Fellers, MD No   Overview Addendum 11/04/2019 10:51 AM by Homero Fellers, MD     Nursing Staff Provider  Office Location  [facility] Dating   LMP=5wk [place]  Language  English Anatomy US    Flu Vaccine  11/04/2019 Genetic Screen  NIPS:   AFP:   First Screen:    TDaP vaccine    Hgb A1C or  GTT Early : Third trimester :   Rhogam     LAB RESULTS   Feeding Plan  Blood Type     Contraception  Antibody    Circumcision  Rubella    Pediatrician   RPR     Support Person  HBsAg     Prenatal Classes  HIV      Varicella   BTL Consent  GBS  (For PCN allergy, check sensitivities)        VBAC Consent  Pap  2020 NIL    Hgb Electro      CF      SMA               Obesity in pregnancy, antepartum 11/04/2019 by Homero Fellers, MD No   Chronic hypertension affecting pregnancy 11/04/2019 by Homero Fellers, MD No   Overview Signed 11/04/2019 10:52 AM by Homero Fellers, MD    Discussed initiation of ASA at [redacted] wks gestation Will return next wek for BP check, consider initation of antihypertensive if needed Baseline labs.           Preterm labor symptoms and general obstetric precautions including but not limited to vaginal bleeding,  contractions, leaking of fluid and fetal movement were reviewed in detail with the patient. Please refer to After Visit Summary for other counseling recommendations.   BP still mildly elevated. Continue to monitor. No meds per ACOG recommendations at this time.  Labs at next visit. BSUS shows continued IUP with cardiac activity.   Return in about 2 weeks (around 11/25/2019) for routine prenatal, NIPT labs, 1h gtt w labs.  Thomasene Mohair, MD, [facility], [facility] 11/11/2019 8:40 AM

## 2019-11-25 ENCOUNTER — Ambulatory Visit (INDEPENDENT_AMBULATORY_CARE_PROVIDER_SITE_OTHER): Payer: BC Managed Care – PPO | Admitting: Obstetrics and Gynecology

## 2019-11-25 ENCOUNTER — Other Ambulatory Visit: Payer: BC Managed Care – PPO

## 2019-11-25 ENCOUNTER — Encounter: Payer: Self-pay | Admitting: Obstetrics and Gynecology

## 2019-11-25 ENCOUNTER — Other Ambulatory Visit: Payer: Self-pay

## 2019-11-25 VITALS — BP 148/88 | Wt 346.0 lb

## 2019-11-25 DIAGNOSIS — O219 Vomiting of pregnancy, unspecified: Secondary | ICD-10-CM

## 2019-11-25 DIAGNOSIS — O09891 Supervision of other high risk pregnancies, first trimester: Secondary | ICD-10-CM

## 2019-11-25 DIAGNOSIS — Z1379 Encounter for other screening for genetic and chromosomal anomalies: Secondary | ICD-10-CM

## 2019-11-25 DIAGNOSIS — O99210 Obesity complicating pregnancy, unspecified trimester: Secondary | ICD-10-CM

## 2019-11-25 DIAGNOSIS — O10911 Unspecified pre-existing hypertension complicating pregnancy, first trimester: Secondary | ICD-10-CM

## 2019-11-25 DIAGNOSIS — O0991 Supervision of high risk pregnancy, unspecified, first trimester: Secondary | ICD-10-CM

## 2019-11-25 DIAGNOSIS — O99211 Obesity complicating pregnancy, first trimester: Secondary | ICD-10-CM

## 2019-11-25 DIAGNOSIS — O169 Unspecified maternal hypertension, unspecified trimester: Secondary | ICD-10-CM | POA: Diagnosis not present

## 2019-11-25 DIAGNOSIS — Z6841 Body Mass Index (BMI) 40.0 and over, adult: Secondary | ICD-10-CM

## 2019-11-25 DIAGNOSIS — Z3481 Encounter for supervision of other normal pregnancy, first trimester: Secondary | ICD-10-CM

## 2019-11-25 DIAGNOSIS — O10919 Unspecified pre-existing hypertension complicating pregnancy, unspecified trimester: Secondary | ICD-10-CM

## 2019-11-25 DIAGNOSIS — Z3490 Encounter for supervision of normal pregnancy, unspecified, unspecified trimester: Secondary | ICD-10-CM

## 2019-11-25 DIAGNOSIS — Z3A10 10 weeks gestation of pregnancy: Secondary | ICD-10-CM

## 2019-11-25 MED ORDER — NIFEDIPINE ER OSMOTIC RELEASE 30 MG PO TB24: 30.0000 mg | ORAL_TABLET | Freq: Every day | ORAL | 2 refills | Status: DC

## 2019-11-25 MED ORDER — PROMETHAZINE HCL 25 MG PO TABS: 25.0000 mg | ORAL_TABLET | Freq: Four times a day (QID) | ORAL | 2 refills | Status: DC | PRN

## 2019-11-25 NOTE — Progress Notes (Signed)
ROB/GTT C/o nausea, gas, bloating, having trouble having BM Slight cramping/spotting

## 2019-11-25 NOTE — Patient Instructions (Addendum)
Hypertension During Pregnancy High blood pressure (hypertension) is when the force of blood pumping through the arteries is too strong. Arteries are blood vessels that carry blood from the heart throughout the body. Hypertension during pregnancy can be mild or severe. Severe hypertension during pregnancy (preeclampsia) is a medical emergency that requires prompt evaluation and treatment. Different types of hypertension can happen during pregnancy. These include:  Chronic hypertension. This happens when you had high blood pressure before you became pregnant, and it continues during the pregnancy. Hypertension that develops before you are [redacted] weeks pregnant and continues during the pregnancy is also called chronic hypertension. If you have chronic hypertension, it will not go away after you have your baby. You will need follow-up visits with your health care provider after you have your baby. Your doctor may want you to keep taking medicine for your blood pressure.  Gestational hypertension. This is hypertension that develops after the 20th week of pregnancy. Gestational hypertension usually goes away after you have your baby, but your health care provider will need to monitor your blood pressure to make sure that it is getting better.  Preeclampsia. This is severe hypertension during pregnancy. This can cause serious complications for you and your baby and can also cause complications for you after the delivery of your baby.  Postpartum preeclampsia. You may develop severe hypertension after giving birth. This usually occurs within 48 hours after childbirth but may occur up to 6 weeks after giving birth. This is rare. How does this affect me? Women who have hypertension during pregnancy have a greater chance of developing hypertension later in life or during future pregnancies. In some cases, hypertension during pregnancy can cause serious complications, such as:  Stroke.  Heart attack.  Injury to  other organs, such as kidneys, lungs, or liver.  Preeclampsia.  Convulsions or seizures.  Placental abruption. How does this affect my baby? Hypertension during pregnancy can affect your baby. Your baby may:  Be born early (prematurely).  Not weigh as much as he or she should at birth (low birth weight).  Not tolerate labor well, leading to an unplanned cesarean delivery. What are the risks? There are certain factors that make it more likely for you to develop hypertension during pregnancy. These include:  Having hypertension during a previous pregnancy.  Being overweight.  Being age 35 or older.  Being pregnant for the first time.  Being pregnant with more than one baby.  Becoming pregnant using fertilization methods, such as IVF (in vitro fertilization).  Having other medical problems, such as diabetes, kidney disease, or lupus.  Having a family history of hypertension. What can I do to lower my risk? The exact cause of hypertension during pregnancy is not known. You may be able to lower your risk by:  Maintaining a healthy weight.  Eating a healthy and balanced diet.  Following your health care provider's instructions about treating any long-term conditions that you had before becoming pregnant. It is very important to keep all of your prenatal care appointments. Your health care provider will check your blood pressure and make sure that your pregnancy is progressing as expected. If a problem is found, early treatment can prevent complications. How is this treated? Treatment for hypertension during pregnancy varies depending on the type of hypertension you have and how serious it is.  If you were taking medicine for high blood pressure before you became pregnant, talk with your health care provider. You may need to change medicine during pregnancy because   some medicines, like ACE inhibitors, may not be considered safe for your baby.  If you have gestational  hypertension, your health care provider may order medicine to treat this during pregnancy.  If you are at risk for preeclampsia, your health care provider may recommend that you take a low-dose aspirin during your pregnancy.  If you have severe hypertension, you may need to be hospitalized so you and your baby can be monitored closely. You may also need to be given medicine to lower your blood pressure. This medicine may be given by mouth or through an IV.  In some cases, if your condition gets worse, you may need to deliver your baby early. Follow these instructions at home: Eating and drinking   Drink enough fluid to keep your urine pale yellow.  Avoid caffeine. Lifestyle  Do not use any products that contain nicotine or tobacco, such as cigarettes, e-cigarettes, and chewing tobacco. If you need help quitting, ask your health care provider.  Do not use alcohol or drugs.  Avoid stress as much as possible.  Rest and get plenty of sleep.  Regular exercise can help to reduce your blood pressure. Ask your health care provider what kinds of exercise are best for you. General instructions  Take over-the-counter and prescription medicines only as told by your health care provider.  Keep all prenatal and follow-up visits as told by your health care provider. This is important. Contact a health care provider if:  You have symptoms that your health care provider told you may require more treatment or monitoring, such as: ? Headaches. ? Nausea or vomiting. ? Abdominal pain. ? Dizziness. ? Light-headedness. Get help right away if:  You have: ? Severe abdominal pain that does not get better with treatment. ? A severe headache that does not get better. ? Vomiting that does not get better. ? Sudden, rapid weight gain. ? Sudden swelling in your hands, ankles, or face. ? Vaginal bleeding. ? Blood in your urine. ? Blurred or double vision. ? Shortness of breath or chest  pain. ? Weakness on one side of your body. ? Difficulty speaking.  Your baby is not moving as much as usual. Summary  High blood pressure (hypertension) is when the force of blood pumping through the arteries is too strong.  Hypertension during pregnancy can cause problems for you and your baby.  Treatment for hypertension during pregnancy varies depending on the type of hypertension you have and how serious it is.  Keep all prenatal and follow-up visits as told by your health care provider. This is important. This information is not intended to replace advice given to you by your health care provider. Make sure you discuss any questions you have with your health care provider. Document Revised: 12/06/2018 Document Reviewed: 09/11/2018 Elsevier Patient Education  2020 [employer].   Initial steps to help :   B6 (pyridoxine) 25 mg,  3-4 times a day Unisom (doxylamine) 25 mg at bedtime **B6 and Unisom are available as a combination prescription medications called diclegis and bonjesta  B1 (thiamin)  50-100 mg 1-4 a day  Continue prenatal vitamin with iron and thiamin. If it is not tolerated switch to 1 mg of folic acid.  Can add medication for gastric reflux if needed.  Subsequent steps to be added to B1, B6, and Unisom:  1. Antihistamine (one of the following medications) Dramamine      25-50 mg every 4-6 hours Benadryl      25-50 mg every 4-6  hours Meclizine      25 mg every 6 hours  2. Dopamine Antagonist (one of the following medications) Metoclopramide  (Reglan)  5-10 mg every 6-8 hours         PO Promethazine   (Phenergan)   12.5-25 mg every 4-6 hours      PO or rectal Prochlorperazine  (Compazine)  5-10 mg every 6-8 hours     25mg  BID rectally    Subsequent steps if there has still not been improvement in symptoms:  3. Daily stool softner  4. Ondansetron  (Zofran)   4-8 mg every 6-8 hours     Hyperemesis Gravidarum Hyperemesis gravidarum is a severe form of  nausea and vomiting that happens during pregnancy. Hyperemesis is worse than morning sickness. It may cause you to have nausea or vomiting all day for many days. It may keep you from eating and drinking enough food and liquids, which can lead to dehydration, malnutrition, and weight loss. Hyperemesis usually occurs during the first half (the first 20 weeks) of pregnancy. It often goes away once a woman is in her second half of pregnancy. However, sometimes hyperemesis continues through an entire pregnancy. What are the causes? The cause of this condition is not known. It may be related to changes in chemicals (hormones) in the body during pregnancy, such as the high level of pregnancy hormone (human chorionic gonadotropin) or the increase in the female sex hormone (estrogen). What are the signs or symptoms? Symptoms of this condition include:  Nausea that does not go away.  Vomiting that does not allow you to keep any food down.  Weight loss.  Body fluid loss (dehydration).  Having no desire to eat, or not liking food that you have previously enjoyed. How is this diagnosed? This condition may be diagnosed based on:  A physical exam.  Your medical history.  Your symptoms.  Blood tests.  Urine tests. How is this treated? This condition is managed by controlling symptoms. This may include:  Following an eating plan. This can help lessen nausea and vomiting.  Taking prescription medicines. An eating plan and medicines are often used together to help control symptoms. If medicines do not help relieve nausea and vomiting, you may need to receive fluids through an IV at the hospital. Follow these instructions at home: Eating and drinking   Avoid the following: ? Drinking fluids with meals. Try not to drink anything during the 30 minutes before and after your meals. ? Drinking more than 1 cup of fluid at a time. ? Eating foods that trigger your symptoms. These may include spicy  foods, coffee, high-fat foods, very sweet foods, and acidic foods. ? Skipping meals. Nausea can be more intense on an empty stomach. If you cannot tolerate food, do not force it. Try sucking on ice chips or other frozen items and make up for missed calories later. ? Lying down within 2 hours after eating. ? Being exposed to environmental triggers. These may include food smells, smoky rooms, closed spaces, rooms with strong smells, warm or humid places, overly loud and noisy rooms, and rooms with motion or flickering lights. Try eating meals in a well-ventilated area that is free of strong smells. ? Quick and sudden changes in your movement. ? Taking iron pills and multivitamins that contain iron. If you take prescription iron pills, do not stop taking them unless your health care provider approves. ? Preparing food. The smell of food can spoil your appetite or trigger nausea.  To help relieve your symptoms: ? Listen to your body. Everyone is different and has different preferences. Find what works best for you. ? Eat and drink slowly. ? Eat 5-6 small meals daily instead of 3 large meals. Eating small meals and snacks can help you avoid an empty stomach. ? In the morning, before getting out of bed, eat a couple of crackers to avoid moving around on an empty stomach. ? Try eating starchy foods as these are usually tolerated well. Examples include cereal, toast, bread, potatoes, pasta, rice, and pretzels. ? Include at least 1 serving of protein with your meals and snacks. Protein options include lean meats, poultry, seafood, beans, nuts, nut butters, eggs, cheese, and yogurt. ? Try eating a protein-rich snack before bed. Examples of a protein-rick snack include cheese and crackers or a peanut butter sandwich made with 1 slice of whole-wheat bread and 1 tsp (5 g) of peanut butter. ? Eat or suck on things that have ginger in them. It may help relieve nausea. Add  tsp ground ginger to hot tea or choose  ginger tea. ? Try drinking 100% fruit juice or an electrolyte drink. An electrolyte drink contains sodium, potassium, and chloride. ? Drink fluids that are cold, clear, and carbonated or sour. Examples include lemonade, ginger ale, lemon-lime soda, ice water, and sparkling water. ? Brush your teeth or use a mouth rinse after meals. ? Talk with your health care provider about starting a supplement of vitamin B6. General instructions  Take over-the-counter and prescription medicines only as told by your health care provider.  Follow instructions from your health care provider about eating or drinking restrictions.  Continue to take your prenatal vitamins as told by your health care provider. If you are having trouble taking your prenatal vitamins, talk with your health care provider about different options.  Keep all follow-up and pre-birth (prenatal) visits as told by your health care provider. This is important. Contact a health care provider if:  You have pain in your abdomen.  You have a severe headache.  You have vision problems.  You are losing weight.  You feel weak or dizzy. Get help right away if:  You cannot drink fluids without vomiting.  You vomit blood.  You have constant nausea and vomiting.  You are very weak.  You faint.  You have a fever and your symptoms suddenly get worse. Summary  Hyperemesis gravidarum is a severe form of nausea and vomiting that happens during pregnancy.  Making some changes to your eating habits may help relieve nausea and vomiting.  This condition may be managed with medicine.  If medicines do not help relieve nausea and vomiting, you may need to receive fluids through an IV at the hospital. This information is not intended to replace advice given to you by your health care provider. Make sure you discuss any questions you have with your health care provider. Document Revised: 09/04/2017 Document Reviewed: 04/13/2016 Elsevier  Patient Education  2020 [employer].

## 2019-11-25 NOTE — Progress Notes (Signed)
Routine Prenatal Care Visit  Subjective  Kelly Stevens is a 29 y.o. G4P0030 at [redacted]w[redacted]d being seen today for ongoing prenatal care.  She is currently monitored for the following issues for this high-risk pregnancy and has Family history of breast cancer; Supervision of high-risk pregnancy; Obesity in pregnancy, antepartum; Chronic hypertension affecting pregnancy; and BMI 50.0-59.9, adult ([place]) on their problem list.  ----------------------------------------------------------------------------------- Patient reports nausea.   Contractions: Not present. Vag. Bleeding: None.  Movement: Absent. Denies leaking of fluid.  ----------------------------------------------------------------------------------- The following portions of the patient's history were reviewed and updated as appropriate: allergies, current medications, past family history, past medical history, past social history, past surgical history and problem list. Problem list updated.   Objective  Blood pressure (!) 148/88, weight (!) 346 lb (156.9 kg), last menstrual period 09/10/2019. Pregravid weight 336 lb (152.4 kg) Total Weight Gain 10 lb (4.536 kg) Urinalysis:      Fetal Status:     Movement: Absent     General:  Alert, oriented and cooperative. Patient is in no acute distress.  Skin: Skin is warm and dry. No rash noted.   Cardiovascular: Normal heart rate noted  Respiratory: Normal respiratory effort, no problems with respiration noted  Abdomen: Soft, gravid, appropriate for gestational age. Pain/Pressure: Absent     Pelvic:  Cervical exam deferred        Extremities: Normal range of motion.  Edema: None  Mental Status: Normal mood and affect. Normal behavior. Normal judgment and thought content.     Assessment   29 y.o. G4P0030 at [redacted]w[redacted]d by  06/16/2020, by Last Menstrual Period presenting for routine prenatal visit  Plan   Pregnancy #4 Problems (from 11/04/19 to present)    Problem Noted Resolved   BMI  50.0-59.9, adult ([place]) 11/11/2019 by Will Bonnet, MD No   Supervision of high-risk pregnancy 11/04/2019 by Homero Fellers, MD No   Overview Addendum 11/04/2019 10:51 AM by Homero Fellers, MD     Nursing Staff Provider  Office Location  [facility] Dating   LMP=5wk [place]  Language  English Anatomy US    Flu Vaccine  11/04/2019 Genetic Screen  NIPS:   AFP:   First Screen:    TDaP vaccine    Hgb A1C or  GTT Early : Third trimester :   Rhogam     LAB RESULTS   Feeding Plan  Blood Type     Contraception  Antibody    Circumcision  Rubella    Pediatrician   RPR     Support Person  HBsAg     Prenatal Classes  HIV      Varicella   BTL Consent  GBS  (For PCN allergy, check sensitivities)        VBAC Consent  Pap  2020 NIL    Hgb Electro      CF      SMA               Obesity in pregnancy, antepartum 11/04/2019 by Homero Fellers, MD No   Chronic hypertension affecting pregnancy 11/04/2019 by Homero Fellers, MD No   Overview Signed 11/04/2019 10:52 AM by Homero Fellers, MD    Discussed initiation of ASA at [redacted] wks gestation Will return next wek for BP check, consider initation of antihypertensive if needed Baseline labs.           Discussed 81 mg ASA initiation at 12 weeks.  Discussed medications for nausea- sent rx for  phenergan.  Discussed initiation of BP medication for CHTN. Rx for procardia sent.  Bedside US for FHR today.   Gestational age appropriate obstetric precautions including but not limited to vaginal bleeding, contractions, leaking of fluid and fetal movement were reviewed in detail with the patient.    Return in about 2 weeks (around 12/09/2019) for ROB in person.  [name] MD [facility], [facility] 11/25/2019, 9:28 AM

## 2019-11-26 LAB — COMPREHENSIVE METABOLIC PANEL
ALT: 12 IU/L (ref 0–32)
AST: 17 IU/L (ref 0–40)
Albumin/Globulin Ratio: 1.4 (ref 1.2–2.2)
Albumin: 3.9 g/dL (ref 3.9–5.0)
Alkaline Phosphatase: 93 IU/L (ref 39–117)
BUN/Creatinine Ratio: 9 (ref 9–23)
BUN: 8 mg/dL (ref 6–20)
Bilirubin Total: <0.2 mg/dL (ref 0.0–1.2)
CO2: 20 mmol/L (ref 20–29)
Calcium: 9.2 mg/dL (ref 8.7–10.2)
Chloride: 101 mmol/L (ref 96–106)
Creatinine, Ser: 0.86 mg/dL (ref 0.57–1.00)
GFR calc Af Amer: 105 mL/min/{1.73_m2} (ref >59)
GFR calc non Af Amer: 91 mL/min/{1.73_m2} (ref >59)
Globulin, Total: 2.7 g/dL (ref 1.5–4.5)
Glucose: 103 mg/dL — ABNORMAL HIGH (ref 65–99)
Potassium: 4.2 mmol/L (ref 3.5–5.2)
Sodium: 137 mmol/L (ref 134–144)
Total Protein: 6.6 g/dL (ref 6.0–8.5)

## 2019-11-26 LAB — GLUCOSE TOLERANCE, 1 HOUR: Glucose, 1Hr PP: 105 mg/dL (ref 65–199)

## 2019-12-02 LAB — MATERNIT21 PLUS CORE+SCA

## 2019-12-09 ENCOUNTER — Ambulatory Visit (INDEPENDENT_AMBULATORY_CARE_PROVIDER_SITE_OTHER): Payer: Medicaid Other | Admitting: Obstetrics and Gynecology

## 2019-12-09 ENCOUNTER — Other Ambulatory Visit: Payer: Self-pay

## 2019-12-09 ENCOUNTER — Encounter: Payer: Self-pay | Admitting: Obstetrics and Gynecology

## 2019-12-09 VITALS — BP 142/94 | Wt 347.0 lb

## 2019-12-09 DIAGNOSIS — Z1379 Encounter for other screening for genetic and chromosomal anomalies: Secondary | ICD-10-CM

## 2019-12-09 DIAGNOSIS — O10919 Unspecified pre-existing hypertension complicating pregnancy, unspecified trimester: Secondary | ICD-10-CM

## 2019-12-09 DIAGNOSIS — O0991 Supervision of high risk pregnancy, unspecified, first trimester: Secondary | ICD-10-CM

## 2019-12-09 DIAGNOSIS — O10911 Unspecified pre-existing hypertension complicating pregnancy, first trimester: Secondary | ICD-10-CM

## 2019-12-09 DIAGNOSIS — O99210 Obesity complicating pregnancy, unspecified trimester: Secondary | ICD-10-CM

## 2019-12-09 DIAGNOSIS — Z3A12 12 weeks gestation of pregnancy: Secondary | ICD-10-CM

## 2019-12-09 DIAGNOSIS — Z6841 Body Mass Index (BMI) 40.0 and over, adult: Secondary | ICD-10-CM

## 2019-12-09 DIAGNOSIS — O99211 Obesity complicating pregnancy, first trimester: Secondary | ICD-10-CM

## 2019-12-09 LAB — POCT URINALYSIS DIPSTICK OB
Glucose, UA: NEGATIVE
POC,PROTEIN,UA: NEGATIVE

## 2019-12-09 NOTE — Progress Notes (Signed)
Routine Prenatal Care Visit  Subjective  Kelly Stevens is a 29 y.o. G4P0030 at [redacted]w[redacted]d being seen today for ongoing prenatal care.  She is currently monitored for the following issues for this high-risk pregnancy and has Family history of breast cancer; Supervision of high-risk pregnancy; Obesity in pregnancy, antepartum; Chronic hypertension affecting pregnancy; and BMI 50.0-59.9, adult ([facility]) on their problem list.  ----------------------------------------------------------------------------------- Patient reports no complaints.   Contractions: Not present. Vag. Bleeding: None.  Movement: Absent. Leaking Fluid denies.  ----------------------------------------------------------------------------------- The following portions of the patient's history were reviewed and updated as appropriate: allergies, current medications, past family history, past medical history, past social history, past surgical history and problem list. Problem list updated.  Objective  Blood pressure (!) 142/94, weight (!) 347 lb (157.4 kg), last menstrual period 09/10/2019. Pregravid weight 336 lb (152.4 kg) Total Weight Gain 11 lb (4.99 kg) Urinalysis: Urine Protein    Urine Glucose    Fetal Status: Fetal Heart Rate (bpm): 160   Movement: Absent     General:  Alert, oriented and cooperative. Patient is in no acute distress.  Skin: Skin is warm and dry. No rash noted.   Cardiovascular: Normal heart rate noted  Respiratory: Normal respiratory effort, no problems with respiration noted  Abdomen: Soft, gravid, appropriate for gestational age. Pain/Pressure: Absent     Pelvic:  Cervical exam deferred        Extremities: Normal range of motion.     Mental Status: Normal mood and affect. Normal behavior. Normal judgment and thought content.   Assessment   29 y.o. G4P0030 at [redacted]w[redacted]d by  06/16/2020, by Last Menstrual Period presenting for routine prenatal visit  Plan   Pregnancy #4 Problems (from 11/04/19 to present)     Problem Noted Resolved   BMI 50.0-59.9, adult ([facility]) 11/11/2019 by Conard Novak, MD No   Supervision of high-risk pregnancy 11/04/2019 by [name], MD No   Overview Addendum 11/04/2019 10:51 AM by [name], MD     Nursing Staff Provider  Office Location  [facility] Dating   LMP=5wk [place]  Language  English Anatomy US    Flu Vaccine  11/04/2019 Genetic Screen  NIPS:   AFP:   First Screen:    TDaP vaccine    Hgb A1C or  GTT Early : Third trimester :   Rhogam     LAB RESULTS   Feeding Plan  Blood Type     Contraception  Antibody    Circumcision  Rubella    Pediatrician   RPR     Support Person  HBsAg     Prenatal Classes  HIV      Varicella   BTL Consent  GBS  (For PCN allergy, check sensitivities)        VBAC Consent  Pap  2020 NIL    Hgb Electro      CF      SMA               Obesity in pregnancy, antepartum 11/04/2019 by [name], MD No   Chronic hypertension affecting pregnancy 11/04/2019 by [name], MD No   Overview Signed 11/04/2019 10:52 AM by [name], MD    Discussed initiation of ASA at [redacted] wks gestation Will return next wek for BP check, consider initation of antihypertensive if needed Baseline labs.           Preterm labor symptoms and general obstetric precautions including but not limited to vaginal  bleeding, contractions, leaking of fluid and fetal movement were reviewed in detail with the patient. Please refer to After Visit Summary for other counseling recommendations.   - start bASA - re-collect NIPT  Return in about 4 weeks (around 01/06/2020) for Routine Prenatal Appointment.  Prentice Docker, MD, [facility], [facility] 12/09/2019 11:41 AM

## 2019-12-09 NOTE — Addendum Note (Signed)
Addended by: Liliane Shi on: 12/09/2019 11:59 AM   Modules accepted: Orders

## 2019-12-16 LAB — MATERNIT 21 PLUS CORE, BLOOD
Fetal Fraction: 4
Result (T21): NEGATIVE
Trisomy 13 (Patau syndrome): NEGATIVE
Trisomy 18 (Edwards syndrome): NEGATIVE
Trisomy 21 (Down syndrome): NEGATIVE

## 2019-12-23 ENCOUNTER — Ambulatory Visit (INDEPENDENT_AMBULATORY_CARE_PROVIDER_SITE_OTHER): Payer: Medicaid Other | Admitting: Obstetrics and Gynecology

## 2019-12-23 ENCOUNTER — Encounter: Payer: Self-pay | Admitting: Obstetrics and Gynecology

## 2019-12-23 ENCOUNTER — Other Ambulatory Visit: Payer: Self-pay

## 2019-12-23 VITALS — BP 140/80 | Wt 349.0 lb

## 2019-12-23 DIAGNOSIS — Z3A14 14 weeks gestation of pregnancy: Secondary | ICD-10-CM

## 2019-12-23 DIAGNOSIS — O10919 Unspecified pre-existing hypertension complicating pregnancy, unspecified trimester: Secondary | ICD-10-CM

## 2019-12-23 DIAGNOSIS — O0992 Supervision of high risk pregnancy, unspecified, second trimester: Secondary | ICD-10-CM

## 2019-12-23 DIAGNOSIS — O99210 Obesity complicating pregnancy, unspecified trimester: Secondary | ICD-10-CM

## 2019-12-23 DIAGNOSIS — O99212 Obesity complicating pregnancy, second trimester: Secondary | ICD-10-CM

## 2019-12-23 DIAGNOSIS — O10912 Unspecified pre-existing hypertension complicating pregnancy, second trimester: Secondary | ICD-10-CM

## 2019-12-23 DIAGNOSIS — O0991 Supervision of high risk pregnancy, unspecified, first trimester: Secondary | ICD-10-CM

## 2019-12-23 LAB — POCT URINALYSIS DIPSTICK OB: Glucose, UA: NEGATIVE

## 2019-12-23 MED ORDER — NIFEDIPINE ER OSMOTIC RELEASE 60 MG PO TB24: 60.0000 mg | ORAL_TABLET | Freq: Every day | ORAL | 3 refills | Status: DC

## 2019-12-23 MED ORDER — BLOOD PRESSURE KIT: 1.0000 | PACK | Freq: Two times a day (BID) | 0 refills | Status: DC

## 2019-12-23 NOTE — Patient Instructions (Addendum)
Second Trimester of Pregnancy The second trimester is from week 14 through week 27 (months 4 through 6). The second trimester is often a time when you feel your best. Your body has adjusted to being pregnant, and you begin to feel better physically. Usually, morning sickness has lessened or quit completely, you may have more energy, and you may have an increase in appetite. The second trimester is also a time when the fetus is growing rapidly. At the end of the sixth month, the fetus is about 9 inches long and weighs about 1 pounds. You will likely begin to feel the baby move (quickening) between 16 and 20 weeks of pregnancy. Body changes during your second trimester Your body continues to go through many changes during your second trimester. The changes vary from woman to woman.  Your weight will continue to increase. You will notice your lower abdomen bulging out.  You may begin to get stretch marks on your hips, abdomen, and breasts.  You may develop headaches that can be relieved by medicines. The medicines should be approved by your health care provider.  You may urinate more often because the fetus is pressing on your bladder.  You may develop or continue to have heartburn as a result of your pregnancy.  You may develop constipation because certain hormones are causing the muscles that push waste through your intestines to slow down.  You may develop hemorrhoids or swollen, bulging veins (varicose veins).  You may have back pain. This is caused by: ? Weight gain. ? Pregnancy hormones that are relaxing the joints in your pelvis. ? A shift in weight and the muscles that support your balance.  Your breasts will continue to grow and they will continue to become tender.  Your gums may bleed and may be sensitive to brushing and flossing.  Dark spots or blotches (chloasma, mask of pregnancy) may develop on your face. This will likely fade after the baby is born.  A dark line from your  belly button to the pubic area (linea nigra) may appear. This will likely fade after the baby is born.  You may have changes in your hair. These can include thickening of your hair, rapid growth, and changes in texture. Some women also have hair loss during or after pregnancy, or hair that feels dry or thin. Your hair will most likely return to normal after your baby is born. What to expect at prenatal visits During a routine prenatal visit:  You will be weighed to make sure you and the fetus are growing normally.  Your blood pressure will be taken.  Your abdomen will be measured to track your baby's growth.  The fetal heartbeat will be listened to.  Any test results from the previous visit will be discussed. Your health care provider may ask you:  How you are feeling.  If you are feeling the baby move.  If you have had any abnormal symptoms, such as leaking fluid, bleeding, severe headaches, or abdominal cramping.  If you are using any tobacco products, including cigarettes, chewing tobacco, and electronic cigarettes.  If you have any questions. Other tests that may be performed during your second trimester include:  Blood tests that check for: ? Low iron levels (anemia). ? High blood sugar that affects pregnant women (gestational diabetes) between 24 and 28 weeks. ? Rh antibodies. This is to check for a protein on red blood cells (Rh factor).  Urine tests to check for infections, diabetes, or protein in the   urine.  An ultrasound to confirm the proper growth and development of the baby.  An amniocentesis to check for possible genetic problems.  Fetal screens for spina bifida and Down syndrome.  HIV (human immunodeficiency virus) testing. Routine prenatal testing includes screening for HIV, unless you choose not to have this test. Follow these instructions at home: Medicines  Follow your health care provider's instructions regarding medicine use. Specific medicines may be  either safe or unsafe to take during pregnancy.  Take a prenatal vitamin that contains at least 600 micrograms (mcg) of folic acid.  If you develop constipation, try taking a stool softener if your health care provider approves. Eating and drinking   Eat a balanced diet that includes fresh fruits and vegetables, whole grains, good sources of protein such as meat, eggs, or tofu, and low-fat dairy. Your health care provider will help you determine the amount of weight gain that is right for you.  Avoid raw meat and uncooked cheese. These carry germs that can cause birth defects in the baby.  If you have low calcium intake from food, talk to your health care provider about whether you should take a daily calcium supplement.  Limit foods that are high in fat and processed sugars, such as fried and sweet foods.  To prevent constipation: ? Drink enough fluid to keep your urine clear or pale yellow. ? Eat foods that are high in fiber, such as fresh fruits and vegetables, whole grains, and beans. Activity  Exercise only as directed by your health care provider. Most women can continue their usual exercise routine during pregnancy. Try to exercise for 30 minutes at least 5 days a week. Stop exercising if you experience uterine contractions.  Avoid heavy lifting, wear low heel shoes, and practice good posture.  A sexual relationship may be continued unless your health care provider directs you otherwise. Relieving pain and discomfort  Wear a good support bra to prevent discomfort from breast tenderness.  Take warm sitz baths to soothe any pain or discomfort caused by hemorrhoids. Use hemorrhoid cream if your health care provider approves.  Rest with your legs elevated if you have leg cramps or low back pain.  If you develop varicose veins, wear support hose. Elevate your feet for 15 minutes, 3-4 times a day. Limit salt in your diet. Prenatal Care  Write down your questions. Take them to  your prenatal visits.  Keep all your prenatal visits as told by your health care provider. This is important. Safety  Wear your seat belt at all times when driving.  Make a list of emergency phone numbers, including numbers for family, friends, the hospital, and police and fire departments. General instructions  Ask your health care provider for a referral to a local prenatal education class. Begin classes no later than the beginning of month 6 of your pregnancy.  Ask for help if you have counseling or nutritional needs during pregnancy. Your health care provider can offer advice or refer you to specialists for help with various needs.  Do not use hot tubs, steam rooms, or saunas.  Do not douche or use tampons or scented sanitary pads.  Do not cross your legs for long periods of time.  Avoid cat litter boxes and soil used by cats. These carry germs that can cause birth defects in the baby and possibly loss of the fetus by miscarriage or stillbirth.  Avoid all smoking, herbs, alcohol, and unprescribed drugs. Chemicals in these products can affect the formation   and growth of the baby.  Do not use any products that contain nicotine or tobacco, such as cigarettes and e-cigarettes. If you need help quitting, ask your health care provider.  Visit your dentist if you have not gone yet during your pregnancy. Use a soft toothbrush to brush your teeth and be gentle when you floss. Contact a health care provider if:  You have dizziness.  You have mild pelvic cramps, pelvic pressure, or nagging pain in the abdominal area.  You have persistent nausea, vomiting, or diarrhea.  You have a bad smelling vaginal discharge.  You have pain when you urinate. Get help right away if:  You have a fever.  You are leaking fluid from your vagina.  You have spotting or bleeding from your vagina.  You have severe abdominal cramping or pain.  You have rapid weight gain or weight loss.  You have  shortness of breath with chest pain.  You notice sudden or extreme swelling of your face, hands, ankles, feet, or legs.  You have not felt your baby move in over an hour.  You have severe headaches that do not go away when you take medicine.  You have vision changes. Summary  The second trimester is from week 14 through week 27 (months 4 through 6). It is also a time when the fetus is growing rapidly.  Your body goes through many changes during pregnancy. The changes vary from woman to woman.  Avoid all smoking, herbs, alcohol, and unprescribed drugs. These chemicals affect the formation and growth your baby.  Do not use any tobacco products, such as cigarettes, chewing tobacco, and e-cigarettes. If you need help quitting, ask your health care provider.  Contact your health care provider if you have any questions. Keep all prenatal visits as told by your health care provider. This is important. This information is not intended to replace advice given to you by your health care provider. Make sure you discuss any questions you have with your health care provider. Document Revised: 12/07/2018 Document Reviewed: 09/20/2016 Elsevier Patient Education  2020 [employer].  How to Take Your Blood Pressure Blood pressure is a measurement of how strongly your blood is pressing against the walls of your arteries. Arteries are blood vessels that carry blood from your heart throughout your body. Your health care provider takes your blood pressure at each office visit. You can also take your own blood pressure at home with a blood pressure machine. You may need to take your own blood pressure:  To confirm a diagnosis of high blood pressure (hypertension).  To monitor your blood pressure over time.  To make sure your blood pressure medicine is working. Supplies needed: To take your blood pressure, you will need a blood pressure machine. You can buy a blood pressure machine, or blood pressure  monitor, at most drugstores or online. There are several types of home blood pressure monitors. When choosing one, consider the following:  Choose a monitor that has an arm cuff.  Choose a cuff that wraps snugly around your upper arm. You should be able to fit only one finger between your arm and the cuff.  Do not choose a monitor that measures your blood pressure from your wrist or finger. Your health care provider can suggest a reliable monitor that will meet your needs. How to prepare To get the most accurate reading, avoid the following for 30 minutes before you check your blood pressure:  Drinking caffeine.  Drinking alcohol.  Eating.  Smoking.  Exercising. Five minutes before you check your blood pressure:  Empty your bladder.  Sit quietly without talking in a dining chair, rather than in a soft couch or armchair. How to take your blood pressure To check your blood pressure, follow the instructions in the manual that came with your blood pressure monitor. If you have a digital blood pressure monitor, the instructions may be as follows: 1. Sit up straight. 2. Place your feet on the floor. Do not cross your ankles or legs. 3. Rest your left arm at the level of your heart on a table or desk or on the arm of a chair. 4. Pull up your shirt sleeve. 5. Wrap the blood pressure cuff around the upper part of your left arm, 1 inch (2.5 cm) above your elbow. It is best to wrap the cuff around bare skin. 6. Fit the cuff snugly around your arm. You should be able to place only one finger between the cuff and your arm. 7. Position the cord inside the groove of your elbow. 8. Press the power button. 9. Sit quietly while the cuff inflates and deflates. 10. Read the digital reading on the monitor screen and write it down (record it). 11. Wait 2-3 minutes, then repeat the steps, starting at step 1. What does my blood pressure reading mean? A blood pressure reading consists of a higher  number over a lower number. Ideally, your blood pressure should be below 120/80. The first ("top") number is called the systolic pressure. It is a measure of the pressure in your arteries as your heart beats. The second ("bottom") number is called the diastolic pressure. It is a measure of the pressure in your arteries as the heart relaxes. Blood pressure is classified into four stages. The following are the stages for adults who do not have a short-term serious illness or a chronic condition. Systolic pressure and diastolic pressure are measured in a unit called mm Hg. Normal  Systolic pressure: below 010.  Diastolic pressure: below 80. Elevated  Systolic pressure: 932-355.  Diastolic pressure: below 80. Hypertension stage 1  Systolic pressure: 732-202.  Diastolic pressure: 54-27. Hypertension stage 2  Systolic pressure: 062 or above.  Diastolic pressure: 90 or above. You can have prehypertension or hypertension even if only the systolic or only the diastolic number in your reading is higher than normal. Follow these instructions at home:  Check your blood pressure as often as recommended by your health care provider.  Take your monitor to the next appointment with your health care provider to make sure: ? That you are using it correctly. ? That it provides accurate readings.  Be sure you understand what your goal blood pressure numbers are.  Tell your health care provider if you are having any side effects from blood pressure medicine. Contact a health care provider if:  Your blood pressure is consistently high. Get help right away if:  Your systolic blood pressure is higher than 180.  Your diastolic blood pressure is higher than 110. This information is not intended to replace advice given to you by your health care provider. Make sure you discuss any questions you have with your health care provider. Document Revised: 07/28/2017 Document Reviewed: 01/22/2016 Elsevier  Patient Education  2020 [employer].

## 2019-12-23 NOTE — Progress Notes (Signed)
Routine Prenatal Care Visit  Subjective  Naeema Patlan is a 29 y.o. G4P0030 at [redacted]w[redacted]d being seen today for ongoing prenatal care.  She is currently monitored for the following issues for this high-risk pregnancy and has Family history of breast cancer; Supervision of high-risk pregnancy; Obesity in pregnancy, antepartum; Chronic hypertension affecting pregnancy; and BMI 50.0-59.9, adult (HCC) on their problem list.  ----------------------------------------------------------------------------------- Patient reports no complaints.   Contractions: Not present. Vag. Bleeding: None.  Movement: Absent. Denies leaking of fluid.  ----------------------------------------------------------------------------------- The following portions of the patient's history were reviewed and updated as appropriate: allergies, current medications, past family history, past medical history, past social history, past surgical history and problem list. Problem list updated.   Objective  Blood pressure 140/80, weight (!) 349 lb (158.3 kg), last menstrual period 09/10/2019. Pregravid weight 336 lb (152.4 kg) Total Weight Gain 13 lb (5.897 kg) Urinalysis:      Fetal Status: Fetal Heart Rate (bpm): 145   Movement: Absent     General:  Alert, oriented and cooperative. Patient is in no acute distress.  Skin: Skin is warm and dry. No rash noted.   Cardiovascular: Normal heart rate noted  Respiratory: Normal respiratory effort, no problems with respiration noted  Abdomen: Soft, gravid, appropriate for gestational age. Pain/Pressure: Present     Pelvic:  Cervical exam deferred        Extremities: Normal range of motion.  Edema: None  Mental Status: Normal mood and affect. Normal behavior. Normal judgment and thought content.     Assessment   29 y.o. G4P0030 at [redacted]w[redacted]d by  06/16/2020, by Last Menstrual Period presenting for routine prenatal visit  Plan   Pregnancy #4 Problems (from 11/04/19 to present)    Problem Noted Resolved   BMI 50.0-59.9, adult (HCC) 11/11/2019 by Conard Novak, MD No   Supervision of high-risk pregnancy 11/04/2019 by [name], MD No   Overview Addendum 12/23/2019 10:09 AM by [name], MD     Nursing Staff Provider  Office Location  [facility] Dating   LMP=5wk [place]  Language  English Anatomy US    Flu Vaccine  11/04/2019 Genetic Screen  NIPS: Normal XX  TDaP vaccine    Hgb A1C or  GTT Early : 105 Third trimester :   Rhogam   not needed   LAB RESULTS   Feeding Plan  Blood Type B/Positive/-- (03/08 1054)   Contraception  Antibody Negative (03/08 1054)  Circumcision  Rubella <0.90 (03/08 1054) Non-immune  Pediatrician   RPR Non Reactive (03/08 1054)   Support Person  HBsAg Negative (03/08 1054)   Prenatal Classes  HIV Non Reactive (03/08 1054)    Varicella  Immune  BTL Consent  GBS  (For PCN allergy, check sensitivities)        VBAC Consent  Pap  2020 NIL    Hgb Electro   normal    CF      SMA               Obesity in pregnancy, antepartum 11/04/2019 by [name], MD No   Chronic hypertension affecting pregnancy 11/04/2019 by [name], MD No   Overview Addendum 12/23/2019  9:43 AM by [name], MD    Initial visit:  Discussed initiation of ASA at [redacted] wks gestation   Will return next wek for BP check, consider initation of antihypertensive if needed     Baseline labs.   11/25/2019 Started procardia XL 30  mg q day 12/23/2019 Increased procardia XL to 60 mg q day           Increase BP medication today. Follow up in 2 weeks. RX sent for home BP cuff.   Gestational age appropriate obstetric precautions including but not limited to vaginal bleeding, contractions, leaking of fluid and fetal movement were reviewed in detail with the patient.    Return in about 2 weeks (around 01/06/2020) for ROB in person.  Homero Fellers MD [facility], [facility] 12/23/2019, 10:09 AM

## 2020-01-06 ENCOUNTER — Other Ambulatory Visit: Payer: Self-pay

## 2020-01-06 ENCOUNTER — Ambulatory Visit (INDEPENDENT_AMBULATORY_CARE_PROVIDER_SITE_OTHER): Payer: Medicaid Other | Admitting: Obstetrics and Gynecology

## 2020-01-06 ENCOUNTER — Encounter: Payer: Self-pay | Admitting: Obstetrics and Gynecology

## 2020-01-06 VITALS — BP 158/86 | Wt 354.0 lb

## 2020-01-06 DIAGNOSIS — O10919 Unspecified pre-existing hypertension complicating pregnancy, unspecified trimester: Secondary | ICD-10-CM

## 2020-01-06 DIAGNOSIS — O99210 Obesity complicating pregnancy, unspecified trimester: Secondary | ICD-10-CM

## 2020-01-06 DIAGNOSIS — Z3A16 16 weeks gestation of pregnancy: Secondary | ICD-10-CM

## 2020-01-06 DIAGNOSIS — O0992 Supervision of high risk pregnancy, unspecified, second trimester: Secondary | ICD-10-CM

## 2020-01-06 NOTE — Progress Notes (Signed)
Routine Prenatal Care Visit  Subjective  Kelly Stevens is a 29 y.o. G4P0030 at [redacted]w[redacted]d being seen today for ongoing prenatal care.  She is currently monitored for the following issues for this high-risk pregnancy and has Family history of breast cancer; Supervision of high-risk pregnancy; Obesity in pregnancy, antepartum; Chronic hypertension affecting pregnancy; and BMI 50.0-59.9, adult (HCC) on their problem list.  ----------------------------------------------------------------------------------- Patient reports mild swelling that resolved yesterday with feet elevation.   Contractions: Not present. Vag. Bleeding: None.  Movement: Absent. Leaking Fluid denies.  ----------------------------------------------------------------------------------- The following portions of the patient's history were reviewed and updated as appropriate: allergies, current medications, past family history, past medical history, past social history, past surgical history and problem list. Problem list updated.  Objective  Blood pressure (!) 158/86, weight (!) 354 lb (160.6 kg), last menstrual period 09/10/2019. Pregravid weight 336 lb (152.4 kg) Total Weight Gain 18 lb (8.165 kg) Urinalysis: Urine Protein    Urine Glucose    Fetal Status: Fetal Heart Rate (bpm): 150   Movement: Absent     General:  Alert, oriented and cooperative. Patient is in no acute distress.  Skin: Skin is warm and dry. No rash noted.   Cardiovascular: Normal heart rate noted  Respiratory: Normal respiratory effort, no problems with respiration noted  Abdomen: Soft, gravid, appropriate for gestational age. Pain/Pressure: Present     Pelvic:  Cervical exam deferred        Extremities: Normal range of motion.     Mental Status: Normal mood and affect. Normal behavior. Normal judgment and thought content.   Assessment   29 y.o. G4P0030 at [redacted]w[redacted]d by  06/16/2020, by Last Menstrual Period presenting for routine prenatal visit  Plan    Pregnancy #4 Problems (from 11/04/19 to present)    Problem Noted Resolved   BMI 50.0-59.9, adult (HCC) 11/11/2019 by Conard Novak, MD No   Supervision of high-risk pregnancy 11/04/2019 by [name], MD No   Overview Addendum 12/23/2019 10:09 AM by [name], MD     Nursing Staff Provider  Office Location  [facility] Dating   LMP=5wk [place]  Language  English Anatomy US    Flu Vaccine  11/04/2019 Genetic Screen  NIPS: Normal XX  TDaP vaccine    Hgb A1C or  GTT Early : 105 Third trimester :   Rhogam   not needed   LAB RESULTS   Feeding Plan  Blood Type B/Positive/-- (03/08 1054)   Contraception  Antibody Negative (03/08 1054)  Circumcision  Rubella <0.90 (03/08 1054) Non-immune  Pediatrician   RPR Non Reactive (03/08 1054)   Support Person  HBsAg Negative (03/08 1054)   Prenatal Classes  HIV Non Reactive (03/08 1054)    Varicella  Immune  BTL Consent  GBS  (For PCN allergy, check sensitivities)        VBAC Consent  Pap  2020 NIL    Hgb Electro   normal    CF      SMA               Obesity in pregnancy, antepartum 11/04/2019 by [name], MD No   Chronic hypertension affecting pregnancy 11/04/2019 by [name], MD No   Overview Addendum 12/23/2019  9:43 AM by [name], MD    Initial visit:  Discussed initiation of ASA at [redacted] wks gestation   Will return next wek for BP check, consider initation of antihypertensive if needed     Baseline labs.  11/25/2019 Started procardia XL 30 mg q day 12/23/2019 Increased procardia XL to 60 mg q day          Preterm labor symptoms and general obstetric precautions including but not limited to vaginal bleeding, contractions, leaking of fluid and fetal movement were reviewed in detail with the patient. Please refer to After Visit Summary for other counseling recommendations.   -Recommend home BP cuff to compare. She has started taking increased dose of nifedipine (now 60 mg).  Return  in about 2 weeks (around 01/20/2020) for Anatomy u/s and routine prenatal.  Prentice Docker, MD, [place], [facility] 01/06/2020 9:41 AM

## 2020-01-21 ENCOUNTER — Ambulatory Visit (INDEPENDENT_AMBULATORY_CARE_PROVIDER_SITE_OTHER): Payer: Medicaid Other | Admitting: Obstetrics and Gynecology

## 2020-01-21 ENCOUNTER — Other Ambulatory Visit: Payer: Self-pay

## 2020-01-21 ENCOUNTER — Other Ambulatory Visit: Payer: Self-pay | Admitting: Obstetrics and Gynecology

## 2020-01-21 ENCOUNTER — Encounter: Payer: Self-pay | Admitting: Obstetrics and Gynecology

## 2020-01-21 ENCOUNTER — Ambulatory Visit (INDEPENDENT_AMBULATORY_CARE_PROVIDER_SITE_OTHER): Payer: Medicaid Other

## 2020-01-21 VITALS — BP 123/76 | Wt 354.4 lb

## 2020-01-21 DIAGNOSIS — Z3A19 19 weeks gestation of pregnancy: Secondary | ICD-10-CM

## 2020-01-21 DIAGNOSIS — Z3689 Encounter for other specified antenatal screening: Secondary | ICD-10-CM

## 2020-01-21 DIAGNOSIS — O10919 Unspecified pre-existing hypertension complicating pregnancy, unspecified trimester: Secondary | ICD-10-CM

## 2020-01-21 DIAGNOSIS — O99210 Obesity complicating pregnancy, unspecified trimester: Secondary | ICD-10-CM | POA: Diagnosis not present

## 2020-01-21 DIAGNOSIS — Z3686 Encounter for antenatal screening for cervical length: Secondary | ICD-10-CM

## 2020-01-21 DIAGNOSIS — O0991 Supervision of high risk pregnancy, unspecified, first trimester: Secondary | ICD-10-CM

## 2020-01-21 DIAGNOSIS — O0992 Supervision of high risk pregnancy, unspecified, second trimester: Secondary | ICD-10-CM

## 2020-01-21 NOTE — Addendum Note (Signed)
Addended by: Judd Gaudier on: 01/21/2020 11:10 AM   Modules accepted: Orders

## 2020-01-21 NOTE — Progress Notes (Signed)
Routine Prenatal Care Visit  Subjective  Netanya Yazdani is a 29 y.o. G4P0030 at [redacted]w[redacted]d being seen today for ongoing prenatal care.  She is currently monitored for the following issues for this high-risk pregnancy and has Family history of breast cancer; Supervision of high-risk pregnancy; Obesity in pregnancy, antepartum; Chronic hypertension affecting pregnancy; and BMI 50.0-59.9, adult ([place]) on their problem list.  ----------------------------------------------------------------------------------- Patient reports no complaints.   Contractions: Not present. Vag. Bleeding: None.  Movement: Present. Denies leaking of fluid.  ----------------------------------------------------------------------------------- The following portions of the patient's history were reviewed and updated as appropriate: allergies, current medications, past family history, past medical history, past social history, past surgical history and problem list. Problem list updated.   Objective  Blood pressure 123/76, weight (!) 354 lb 6.4 oz (160.8 kg), last menstrual period 09/10/2019. Pregravid weight 336 lb (152.4 kg) Total Weight Gain 18 lb 6.4 oz (8.346 kg) Urinalysis:      Fetal Status: Fetal Heart Rate (bpm): 140   Movement: Present     General:  Alert, oriented and cooperative. Patient is in no acute distress.  Skin: Skin is warm and dry. No rash noted.   Cardiovascular: Normal heart rate noted  Respiratory: Normal respiratory effort, no problems with respiration noted  Abdomen: Soft, gravid, appropriate for gestational age. Pain/Pressure: Absent     Pelvic:  Cervical exam deferred        Extremities: Normal range of motion.  Edema: None  Mental Status: Normal mood and affect. Normal behavior. Normal judgment and thought content.     Assessment   30 y.o. G4P0030 at [redacted]w[redacted]d by  06/16/2020, by Last Menstrual Period presenting for routine prenatal visit  Plan   Pregnancy #4 Problems (from 11/04/19 to  present)    Problem Noted Resolved   BMI 50.0-59.9, adult ([place]) 11/11/2019 by Will Bonnet, MD No   Supervision of high-risk pregnancy 11/04/2019 by Homero Fellers, MD No   Overview Addendum 12/23/2019 10:09 AM by Homero Fellers, MD     Nursing Staff Provider  Office Location  [facility] Dating   LMP=5wk [place]  Language  English Anatomy US    Flu Vaccine  11/04/2019 Genetic Screen  NIPS: Normal XX  TDaP vaccine    Hgb A1C or  GTT Early : 105 Third trimester :   Rhogam   not needed   LAB RESULTS   Feeding Plan  Blood Type B/Positive/-- (03/08 1054)   Contraception  Antibody Negative (03/08 1054)  Circumcision  Rubella <0.90 (03/08 1054) Non-immune  Pediatrician   RPR Non Reactive (03/08 1054)   Support Person  HBsAg Negative (03/08 1054)   Prenatal Classes  HIV Non Reactive (03/08 1054)    Varicella  Immune  BTL Consent  GBS  (For PCN allergy, check sensitivities)        VBAC Consent  Pap  2020 NIL    Hgb Electro   normal    CF      SMA               Obesity in pregnancy, antepartum 11/04/2019 by Homero Fellers, MD No   Chronic hypertension affecting pregnancy 11/04/2019 by Homero Fellers, MD No   Overview Addendum 12/23/2019  9:43 AM by Homero Fellers, MD    Initial visit:  Discussed initiation of ASA at [redacted] wks gestation   Will return next wek for BP check, consider initation of antihypertensive if needed     Baseline labs.  11/25/2019 Started procardia XL 30 mg q day 12/23/2019 Increased procardia XL to 60 mg q day          Discussed COVID vaccination Discussed Anesthesia referral- order placed Will follow up US with MFM, incomplete anatomy today BP reviewed, was taking BPs before medication. Recommended taking medication in AM then taking BP at least 2 hours later.  Consider increasing dose depending on next weeks values.   Gestational age appropriate obstetric precautions including but not limited to vaginal bleeding, contractions, leaking  of fluid and fetal movement were reviewed in detail with the patient.    Return in about 2 weeks (around 02/04/2020) for ROB in person.  [name] MD [facility], [facility] 01/21/2020, 10:44 AM

## 2020-01-21 NOTE — Patient Instructions (Signed)

## 2020-01-31 ENCOUNTER — Encounter
Admission: RE | Admit: 2020-01-31 | Discharge: 2020-01-31 | Disposition: A | Discharge status: Home / Self Care | Payer: Medicaid Other | Source: Ambulatory Visit | Attending: Anesthesiology | Admitting: Anesthesiology

## 2020-01-31 ENCOUNTER — Other Ambulatory Visit: Payer: Self-pay

## 2020-01-31 NOTE — Consult Note (Signed)
[facility] Anesthesia Consultation  Toy Samarin CMK:[id number] DOB: 01-12-1991 DOA: 01/31/2020 PCP: Patient, No Pcp Per   Requesting physician: Dr. Gilman Schmidt Date of consultation: 01/31/20 Reason for consultation: Obesity during pregnancy  CHIEF COMPLAINT:  Obesity during pregnancy  HISTORY OF PRESENT ILLNESS: Shandell Giovanni  is a 29 y.o. female with a known history of obesity during pregnancy. She has had prior miscarriages but no prior term deliveries. She is currently [redacted] weeks pregnant. Denies hx of cardiovascular disease. Denies hx of asthma. BP was normal prior to pregnancy but she has been diagnosed with cHTN as BP have been elevated during pregnancy and she is currently taking Procardia.   PAST MEDICAL HISTORY:   Past Medical History:  Diagnosis Date  . BMI 50.0-59.9, adult ([place])   . Habitual aborter     PAST SURGICAL HISTORY: No past surgical history on file.  SOCIAL HISTORY:  Social History   Tobacco Use  . Smoking status: Former Smoker    Quit date: 09/25/2017    Years since quitting: 2.3  . Smokeless tobacco: Former Network engineer Use Topics  . Alcohol use: No    FAMILY HISTORY:  Family History  Problem Relation Age of Onset  . Breast cancer Maternal Aunt 81  . Uterine cancer Maternal Aunt 34    DRUG ALLERGIES:  Allergies  Allergen Reactions  . Nickel Rash    REVIEW OF SYSTEMS:   RESPIRATORY: No cough, shortness of breath, wheezing.  CARDIOVASCULAR: No chest pain, orthopnea, edema.  HEMATOLOGY: No anemia, easy bruising or bleeding SKIN: No rash or lesion. NEUROLOGIC: No tingling, numbness, weakness.  PSYCHIATRY: No anxiety or depression.   MEDICATIONS AT HOME:  Prior to Admission medications   Medication Sig Start Date End Date Taking? Authorizing Provider  Blood Pressure KIT 1 each by Does not apply route in the morning and at bedtime. 12/23/19   Schuman, Stefanie Libel, MD  NIFEdipine (PROCARDIA XL) 60 MG 24  hr tablet Take 1 tablet (60 mg total) by mouth daily. 12/23/19   Schuman, Stefanie Libel, MD  progesterone (PROMETRIUM) 200 MG capsule Place 200 mg vaginally at bedtime. 11/12/19   [provider]  progesterone 200 MG SUPP Place 1 suppository (200 mg total) vaginally at bedtime. 11/04/19   Schuman, Stefanie Libel, MD  promethazine (PHENERGAN) 25 MG tablet Take 1 tablet (25 mg total) by mouth every 6 (six) hours as needed for nausea or vomiting. 11/25/19   Adrian Prows R, MD      PHYSICAL EXAMINATION:   VITAL SIGNS: Last menstrual period 09/10/2019.  GENERAL:  29 y.o.-year-old patient no acute distress.  HEENT: Head atraumatic, normocephalic. Oropharynx and nasopharynx clear. MP 2, TM distance >3 cm, normal mouth opening, grade 1 upper lip bite. LUNGS: Normal breath sounds bilaterally, no wheezing, rales,rhonchi. No use of accessory muscles of respiration.  CARDIOVASCULAR: S1, S2 normal. No murmurs, rubs, or gallops.  EXTREMITIES: No pedal edema, cyanosis, or clubbing.  NEUROLOGIC: normal gait PSYCHIATRIC: The patient is alert and oriented x 3.  SKIN: No obvious rash, lesion, or ulcer.    IMPRESSION AND PLAN:   Ovida Delagarza  is a 29 y.o. female presenting with obesity during pregnancy. BMI is currently 55 at [redacted] weeks gestation.  Airway exam reassuring. Spinal interspaces minimally palpable.   Patient had a lot of questions about options for labor analgesia. We discussed several options including IV pain medication, nitrous oxide and epidural analgesia. She was initially very afraid of epidural analgesia, largely due to limited  information. We discussed the safety and effectiveness of the various labor analgesia options.   We discussed that in obesity there can be increased difficulty with epidural placement or even failure of successful epidural. We also discussed that even after successful epidural placement there is increased risk of catheter migration out of the epidural  space that would require catheter replacement. Discussed use of epidural vs spinal vs GA if cesarean delivery is required. Discussed increased risk of difficult intubation during pregnancy should an emergency cesarean delivery be required.   We discussed repeat evaluation at 32-34 weeks by anesthesia to determine whether there is a high risk of complications of anesthesia for which we would recommend transfer of OB care to a facility with a higher maternal level of care designation.

## 2020-02-03 ENCOUNTER — Ambulatory Visit: Payer: Medicaid Other

## 2020-02-04 ENCOUNTER — Encounter: Payer: Medicaid Other | Admitting: Obstetrics and Gynecology

## 2020-02-13 ENCOUNTER — Ambulatory Visit
Admission: RE | Admit: 2020-02-13 | Discharge: 2020-02-13 | Disposition: A | Discharge status: Home / Self Care | Payer: Medicaid Other | Source: Ambulatory Visit | Attending: Maternal & Fetal Medicine | Admitting: Maternal & Fetal Medicine

## 2020-02-13 ENCOUNTER — Other Ambulatory Visit: Payer: Self-pay

## 2020-02-13 ENCOUNTER — Ambulatory Visit (INDEPENDENT_AMBULATORY_CARE_PROVIDER_SITE_OTHER): Payer: Medicaid Other | Admitting: Obstetrics and Gynecology

## 2020-02-13 ENCOUNTER — Encounter: Payer: Self-pay | Admitting: Obstetrics and Gynecology

## 2020-02-13 VITALS — BP 138/84 | Wt 356.0 lb

## 2020-02-13 DIAGNOSIS — E669 Obesity, unspecified: Secondary | ICD-10-CM | POA: Insufficient documentation

## 2020-02-13 DIAGNOSIS — O321XX0 Maternal care for breech presentation, not applicable or unspecified: Secondary | ICD-10-CM | POA: Insufficient documentation

## 2020-02-13 DIAGNOSIS — O0992 Supervision of high risk pregnancy, unspecified, second trimester: Secondary | ICD-10-CM

## 2020-02-13 DIAGNOSIS — O10919 Unspecified pre-existing hypertension complicating pregnancy, unspecified trimester: Secondary | ICD-10-CM

## 2020-02-13 DIAGNOSIS — Z131 Encounter for screening for diabetes mellitus: Secondary | ICD-10-CM

## 2020-02-13 DIAGNOSIS — Z113 Encounter for screening for infections with a predominantly sexual mode of transmission: Secondary | ICD-10-CM

## 2020-02-13 DIAGNOSIS — O99212 Obesity complicating pregnancy, second trimester: Secondary | ICD-10-CM | POA: Diagnosis not present

## 2020-02-13 DIAGNOSIS — Z3689 Encounter for other specified antenatal screening: Secondary | ICD-10-CM | POA: Diagnosis not present

## 2020-02-13 DIAGNOSIS — O10012 Pre-existing essential hypertension complicating pregnancy, second trimester: Secondary | ICD-10-CM | POA: Diagnosis not present

## 2020-02-13 DIAGNOSIS — Z3A22 22 weeks gestation of pregnancy: Secondary | ICD-10-CM | POA: Diagnosis not present

## 2020-02-13 DIAGNOSIS — Z6841 Body Mass Index (BMI) 40.0 and over, adult: Secondary | ICD-10-CM

## 2020-02-13 DIAGNOSIS — O99210 Obesity complicating pregnancy, unspecified trimester: Secondary | ICD-10-CM

## 2020-02-13 NOTE — Progress Notes (Signed)
Routine Prenatal Care Visit  Subjective  Kelly Stevens is a 29 y.o. G4P0030 at [redacted]w[redacted]d being seen today for ongoing prenatal care.  She is currently monitored for the following issues for this high-risk pregnancy and has Family history of breast cancer; Supervision of high-risk pregnancy; Obesity in pregnancy, antepartum; Chronic hypertension affecting pregnancy; and BMI 50.0-59.9, adult ([place]) on their problem list.  ----------------------------------------------------------------------------------- Patient reports intermittent dizziness under different circumstances.  BP not too low during these episodes.  Has not lost consciousness.  Resolves quickly.  Contractions: Not present. Vag. Bleeding: None.  Movement: Present. Leaking Fluid denies.  MFM u/s today. No report yet. Per pt. Follow up in one month.  ----------------------------------------------------------------------------------- The following portions of the patient's history were reviewed and updated as appropriate: allergies, current medications, past family history, past medical history, past social history, past surgical history and problem list. Problem list updated.  Objective  Blood pressure 138/84, weight (!) 356 lb (161.5 kg), last menstrual period 09/10/2019. Pregravid weight 336 lb (152.4 kg) Total Weight Gain 20 lb (9.072 kg) Urinalysis: Urine Protein    Urine Glucose    Fetal Status: Fetal Heart Rate (bpm): 145   Movement: Present     General:  Alert, oriented and cooperative. Patient is in no acute distress.  Skin: Skin is warm and dry. No rash noted.   Cardiovascular: Normal heart rate noted  Respiratory: Normal respiratory effort, no problems with respiration noted  Abdomen: Soft, gravid, appropriate for gestational age. Pain/Pressure: Absent     Pelvic:  Cervical exam deferred        Extremities: Normal range of motion.  Edema: None  Mental Status: Normal mood and affect. Normal behavior. Normal judgment and  thought content.   Assessment   29 y.o. G4P0030 at [redacted]w[redacted]d by  06/16/2020, by Last Menstrual Period presenting for routine prenatal visit  Plan   Pregnancy #4 Problems (from 11/04/19 to present)    Problem Noted Resolved   BMI 50.0-59.9, adult ([place]) 11/11/2019 by Will Bonnet, MD No   Supervision of high-risk pregnancy 11/04/2019 by Homero Fellers, MD No   Overview Addendum 12/23/2019 10:09 AM by Homero Fellers, MD     Nursing Staff Provider  Office Location  [facility] Dating   LMP=5wk [place]  Language  English Anatomy US    Flu Vaccine  11/04/2019 Genetic Screen  NIPS: Normal XX  TDaP vaccine    Hgb A1C or  GTT Early : 105 Third trimester :   Rhogam   not needed   LAB RESULTS   Feeding Plan  Blood Type B/Positive/-- (03/08 1054)   Contraception  Antibody Negative (03/08 1054)  Circumcision  Rubella <0.90 (03/08 1054) Non-immune  Pediatrician   RPR Non Reactive (03/08 1054)   Support Person  HBsAg Negative (03/08 1054)   Prenatal Classes  HIV Non Reactive (03/08 1054)    Varicella  Immune  BTL Consent  GBS  (For PCN allergy, check sensitivities)        VBAC Consent  Pap  2020 NIL    Hgb Electro   normal    CF      SMA               Previous Version   Obesity in pregnancy, antepartum 11/04/2019 by Homero Fellers, MD No   Chronic hypertension affecting pregnancy 11/04/2019 by Homero Fellers, MD No   Overview Addendum 12/23/2019  9:43 AM by Homero Fellers, MD    Initial visit:  Discussed initiation of ASA at [redacted] wks gestation   Will return next wek for BP check, consider initation of antihypertensive if needed     Baseline labs.   11/25/2019 Started procardia XL 30 mg q day 12/23/2019 Increased procardia XL to 60 mg q day      Previous Version       Preterm labor symptoms and general obstetric precautions including but not limited to vaginal bleeding, contractions, leaking of fluid and fetal movement were reviewed in detail with the  patient. Please refer to After Visit Summary for other counseling recommendations.   Return in about 4 weeks (around 03/12/2020) for 28 week labs, routine prenatal.  Thomasene Mohair, MD, [facility], [facility] 02/13/2020 3:56 PM

## 2020-02-13 NOTE — ED Notes (Signed)
Pt to Maternal [facility] Care @ [facility] today with sig other.  BP elevated 151/87.  Dr. Grace Bushy notified.  Pt stated that she did not take her BP meds this AM but will take them right when she gets home.

## 2020-02-13 NOTE — Progress Notes (Signed)
Pt's BP rechecked prior to leaving the [facility] and [facility].  Dr. Grace Bushy verbally notified.

## 2020-03-05 ENCOUNTER — Other Ambulatory Visit: Payer: Self-pay | Admitting: Obstetrics and Gynecology

## 2020-03-05 DIAGNOSIS — O10919 Unspecified pre-existing hypertension complicating pregnancy, unspecified trimester: Secondary | ICD-10-CM

## 2020-03-05 DIAGNOSIS — O99212 Obesity complicating pregnancy, second trimester: Secondary | ICD-10-CM

## 2020-03-12 ENCOUNTER — Other Ambulatory Visit: Payer: Medicaid Other

## 2020-03-12 ENCOUNTER — Other Ambulatory Visit: Payer: Self-pay

## 2020-03-12 ENCOUNTER — Encounter: Payer: Self-pay | Admitting: Obstetrics & Gynecology

## 2020-03-12 ENCOUNTER — Ambulatory Visit (INDEPENDENT_AMBULATORY_CARE_PROVIDER_SITE_OTHER): Payer: Medicaid Other | Admitting: Obstetrics & Gynecology

## 2020-03-12 ENCOUNTER — Ambulatory Visit: Payer: Medicaid Other

## 2020-03-12 ENCOUNTER — Ambulatory Visit
Admission: RE | Admit: 2020-03-12 | Discharge: 2020-03-12 | Disposition: A | Discharge status: Home / Self Care | Payer: Medicaid Other | Source: Ambulatory Visit | Attending: Maternal & Fetal Medicine | Admitting: Maternal & Fetal Medicine

## 2020-03-12 VITALS — BP 130/90 | Wt 353.0 lb

## 2020-03-12 DIAGNOSIS — O99210 Obesity complicating pregnancy, unspecified trimester: Secondary | ICD-10-CM

## 2020-03-12 DIAGNOSIS — Z3A26 26 weeks gestation of pregnancy: Secondary | ICD-10-CM | POA: Insufficient documentation

## 2020-03-12 DIAGNOSIS — O321XX0 Maternal care for breech presentation, not applicable or unspecified: Secondary | ICD-10-CM | POA: Insufficient documentation

## 2020-03-12 DIAGNOSIS — O10919 Unspecified pre-existing hypertension complicating pregnancy, unspecified trimester: Secondary | ICD-10-CM

## 2020-03-12 DIAGNOSIS — O99212 Obesity complicating pregnancy, second trimester: Secondary | ICD-10-CM | POA: Diagnosis present

## 2020-03-12 DIAGNOSIS — E669 Obesity, unspecified: Secondary | ICD-10-CM | POA: Diagnosis not present

## 2020-03-12 DIAGNOSIS — O10912 Unspecified pre-existing hypertension complicating pregnancy, second trimester: Secondary | ICD-10-CM

## 2020-03-12 DIAGNOSIS — O0992 Supervision of high risk pregnancy, unspecified, second trimester: Secondary | ICD-10-CM

## 2020-03-12 DIAGNOSIS — Z131 Encounter for screening for diabetes mellitus: Secondary | ICD-10-CM

## 2020-03-12 DIAGNOSIS — Z113 Encounter for screening for infections with a predominantly sexual mode of transmission: Secondary | ICD-10-CM

## 2020-03-12 DIAGNOSIS — Z6841 Body Mass Index (BMI) 40.0 and over, adult: Secondary | ICD-10-CM

## 2020-03-12 NOTE — Patient Instructions (Signed)
Thank you for choosing [facility]. As part of our ongoing efforts to improve patient experience, we would appreciate your feedback. Please fill out the short survey that you will receive by mail or MyChart. Your opinion is important to us! -Dr Bailie Christenbury   Third Trimester of Pregnancy The third trimester is from week 28 through week 40 (months 7 through 9). The third trimester is a time when the unborn baby (fetus) is growing rapidly. At the end of the ninth month, the fetus is about 20 inches in length and weighs 6-10 pounds. Body changes during your third trimester Your body will continue to go through many changes during pregnancy. The changes vary from woman to woman. During the third trimester:  Your weight will continue to increase. You can expect to gain 25-35 pounds (11-16 kg) by the end of the pregnancy.  You may begin to get stretch marks on your hips, abdomen, and breasts.  You may urinate more often because the fetus is moving lower into your pelvis and pressing on your bladder.  You may develop or continue to have heartburn. This is caused by increased hormones that slow down muscles in the digestive tract.  You may develop or continue to have constipation because increased hormones slow digestion and cause the muscles that push waste through your intestines to relax.  You may develop hemorrhoids. These are swollen veins (varicose veins) in the rectum that can itch or be painful.  You may develop swollen, bulging veins (varicose veins) in your legs.  You may have increased body aches in the pelvis, back, or thighs. This is due to weight gain and increased hormones that are relaxing your joints.  You may have changes in your hair. These can include thickening of your hair, rapid growth, and changes in texture. Some women also have hair loss during or after pregnancy, or hair that feels dry or thin. Your hair will most likely return to normal after your baby is born.  Your  breasts will continue to grow and they will continue to become tender. A yellow fluid (colostrum) may leak from your breasts. This is the first milk you are producing for your baby.  Your belly button may stick out.  You may notice more swelling in your hands, face, or ankles.  You may have increased tingling or numbness in your hands, arms, and legs. The skin on your belly may also feel numb.  You may feel short of breath because of your expanding uterus.  You may have more problems sleeping. This can be caused by the size of your belly, increased need to urinate, and an increase in your body's metabolism.  You may notice the fetus "dropping," or moving lower in your abdomen (lightening).  You may have increased vaginal discharge.  You may notice your joints feel loose and you may have pain around your pelvic bone. What to expect at prenatal visits You will have prenatal exams every 2 weeks until week 36. Then you will have weekly prenatal exams. During a routine prenatal visit:  You will be weighed to make sure you and the baby are growing normally.  Your blood pressure will be taken.  Your abdomen will be measured to track your baby's growth.  The fetal heartbeat will be listened to.  Any test results from the previous visit will be discussed.  You may have a cervical check near your due date to see if your cervix has softened or thinned (effaced).  You will be   tested for Group B streptococcus. This happens between 35 and 37 weeks. Your health care provider may ask you:  What your birth plan is.  How you are feeling.  If you are feeling the baby move.  If you have had any abnormal symptoms, such as leaking fluid, bleeding, severe headaches, or abdominal cramping.  If you are using any tobacco products, including cigarettes, chewing tobacco, and electronic cigarettes.  If you have any questions. Other tests or screenings that may be performed during your third  trimester include:  Blood tests that check for low iron levels (anemia).  Fetal testing to check the health, activity level, and growth of the fetus. Testing is done if you have certain medical conditions or if there are problems during the pregnancy.  Nonstress test (NST). This test checks the health of your baby to make sure there are no signs of problems, such as the baby not getting enough oxygen. During this test, a belt is placed around your belly. The baby is made to move, and its heart rate is monitored during movement. What is false labor? False labor is a condition in which you feel small, irregular tightenings of the muscles in the womb (contractions) that usually go away with rest, changing position, or drinking water. These are called Braxton Hicks contractions. Contractions may last for hours, days, or even weeks before true labor sets in. If contractions come at regular intervals, become more frequent, increase in intensity, or become painful, you should see your health care provider. What are the signs of labor?  Abdominal cramps.  Regular contractions that start at 10 minutes apart and become stronger and more frequent with time.  Contractions that start on the top of the uterus and spread down to the lower abdomen and back.  Increased pelvic pressure and dull back pain.  A watery or bloody mucus discharge that comes from the vagina.  Leaking of amniotic fluid. This is also known as your "water breaking." It could be a slow trickle or a gush. Let your health care provider know if it has a color or strange odor. If you have any of these signs, call your health care provider right away, even if it is before your due date. Follow these instructions at home: Medicines  Follow your health care provider's instructions regarding medicine use. Specific medicines may be either safe or unsafe to take during pregnancy.  Take a prenatal vitamin that contains at least 600 micrograms  (mcg) of folic acid.  If you develop constipation, try taking a stool softener if your health care provider approves. Eating and drinking   Eat a balanced diet that includes fresh fruits and vegetables, whole grains, good sources of protein such as meat, eggs, or tofu, and low-fat dairy. Your health care provider will help you determine the amount of weight gain that is right for you.  Avoid raw meat and uncooked cheese. These carry germs that can cause birth defects in the baby.  If you have low calcium intake from food, talk to your health care provider about whether you should take a daily calcium supplement.  Eat four or five small meals rather than three large meals a day.  Limit foods that are high in fat and processed sugars, such as fried and sweet foods.  To prevent constipation: ? Drink enough fluid to keep your urine clear or pale yellow. ? Eat foods that are high in fiber, such as fresh fruits and vegetables, whole grains, and beans. Activity    Exercise only as directed by your health care provider. Most women can continue their usual exercise routine during pregnancy. Try to exercise for 30 minutes at least 5 days a week. Stop exercising if you experience uterine contractions.  Avoid heavy lifting.  Do not exercise in extreme heat or humidity, or at high altitudes.  Wear low-heel, comfortable shoes.  Practice good posture.  You may continue to have sex unless your health care provider tells you otherwise. Relieving pain and discomfort  Take frequent breaks and rest with your legs elevated if you have leg cramps or low back pain.  Take warm sitz baths to soothe any pain or discomfort caused by hemorrhoids. Use hemorrhoid cream if your health care provider approves.  Wear a good support bra to prevent discomfort from breast tenderness.  If you develop varicose veins: ? Wear support pantyhose or compression stockings as told by your healthcare provider. ? Elevate  your feet for 15 minutes, 3-4 times a day. Prenatal care  Write down your questions. Take them to your prenatal visits.  Keep all your prenatal visits as told by your health care provider. This is important. Safety  Wear your seat belt at all times when driving.  Make a list of emergency phone numbers, including numbers for family, friends, the hospital, and police and fire departments. General instructions  Avoid cat litter boxes and soil used by cats. These carry germs that can cause birth defects in the baby. If you have a cat, ask someone to clean the litter box for you.  Do not travel far distances unless it is absolutely necessary and only with the approval of your health care provider.  Do not use hot tubs, steam rooms, or saunas.  Do not drink alcohol.  Do not use any products that contain nicotine or tobacco, such as cigarettes and e-cigarettes. If you need help quitting, ask your health care provider.  Do not use any medicinal herbs or unprescribed drugs. These chemicals affect the formation and growth of the baby.  Do not douche or use tampons or scented sanitary pads.  Do not cross your legs for long periods of time.  To prepare for the arrival of your baby: ? Take prenatal classes to understand, practice, and ask questions about labor and delivery. ? Make a trial run to the hospital. ? Visit the hospital and tour the maternity area. ? Arrange for maternity or paternity leave through employers. ? Arrange for family and friends to take care of pets while you are in the hospital. ? Purchase a rear-facing car seat and make sure you know how to install it in your car. ? Pack your hospital bag. ? Prepare the baby's nursery. Make sure to remove all pillows and stuffed animals from the baby's crib to prevent suffocation.  Visit your dentist if you have not gone during your pregnancy. Use a soft toothbrush to brush your teeth and be gentle when you floss. Contact a health  care provider if:  You are unsure if you are in labor or if your water has broken.  You become dizzy.  You have mild pelvic cramps, pelvic pressure, or nagging pain in your abdominal area.  You have lower back pain.  You have persistent nausea, vomiting, or diarrhea.  You have an unusual or bad smelling vaginal discharge.  You have pain when you urinate. Get help right away if:  Your water breaks before 37 weeks.  You have regular contractions less than 5 minutes apart before   37 weeks.  You have a fever.  You are leaking fluid from your vagina.  You have spotting or bleeding from your vagina.  You have severe abdominal pain or cramping.  You have rapid weight loss or weight gain.  You have shortness of breath with chest pain.  You notice sudden or extreme swelling of your face, hands, ankles, feet, or legs.  Your baby makes fewer than 10 movements in 2 hours.  You have severe headaches that do not go away when you take medicine.  You have vision changes. Summary  The third trimester is from week 28 through week 40, months 7 through 9. The third trimester is a time when the unborn baby (fetus) is growing rapidly.  During the third trimester, your discomfort may increase as you and your baby continue to gain weight. You may have abdominal, leg, and back pain, sleeping problems, and an increased need to urinate.  During the third trimester your breasts will keep growing and they will continue to become tender. A yellow fluid (colostrum) may leak from your breasts. This is the first milk you are producing for your baby.  False labor is a condition in which you feel small, irregular tightenings of the muscles in the womb (contractions) that eventually go away. These are called Braxton Hicks contractions. Contractions may last for hours, days, or even weeks before true labor sets in.  Signs of labor can include: abdominal cramps; regular contractions that start at 10  minutes apart and become stronger and more frequent with time; watery or bloody mucus discharge that comes from the vagina; increased pelvic pressure and dull back pain; and leaking of amniotic fluid. This information is not intended to replace advice given to you by your health care provider. Make sure you discuss any questions you have with your health care provider. Document Revised: 12/06/2018 Document Reviewed: 09/20/2016 Elsevier Patient Education  2020 [employer]  

## 2020-03-12 NOTE — Progress Notes (Signed)
  Subjective  Fetal Movement? yes Contractions? no Leaking Fluid? no Vaginal Bleeding? no BP at home reported as 110-120s/70s, has been not taking her procardia during these past 3 weeks due to these measurements (She says per Dr Jean Rosenthal instructions) Objective  BP 130/90   Wt (!) 353 lb (160.1 kg)   LMP 09/10/2019 (Exact Date)   BMI 56.98 kg/m  General: NAD Pumonary: no increased work of breathing Abdomen: gravid, non-tender Extremities: no edema Psychiatric: mood appropriate, affect full  Assessment  29 y.o. G4P0030 at [redacted]w[redacted]d by  06/16/2020, by Last Menstrual Period presenting for routine prenatal visit  Plan   Problem List Items Addressed This Visit      Cardiovascular and Mediastinum   Chronic hypertension affecting pregnancy     Other   Supervision of high-risk pregnancy   Obesity in pregnancy, antepartum    Other Visit Diagnoses    [redacted] weeks gestation of pregnancy    -  Primary      Pregnancy #4 Problems (from 11/04/19 to present)    Problem Noted Resolved   BMI 50.0-59.9, adult [facility])     Supervision of high-risk pregnancy     Overview Addendum 12/23/2019 10:09 AM by [name], MD     Nursing Staff Provider  Office Location  [facility] Dating   LMP=5wk [place]  Language  English Anatomy US    Flu Vaccine  11/04/2019 Genetic Screen  NIPS: Normal XX  TDaP vaccine    Hgb A1C or  GTT Early : 105 Third trimester :   Rhogam   not needed   LAB RESULTS   Feeding Plan  Blood Type B/Positive/-- (03/08 1054)   Contraception  Antibody Negative (03/08 1054)  Circumcision  Rubella <0.90 (03/08 1054) Non-immune  Pediatrician   RPR Non Reactive (03/08 1054)   Support Person  HBsAg Negative (03/08 1054)   Prenatal Classes  HIV Non Reactive (03/08 1054)    Varicella  Immune  BTL Consent  GBS  (For PCN allergy, check sensitivities)        VBAC Consent  Pap  2020 NIL    Hgb Electro   normal    CF      SMA               Previous Version   Obesity in pregnancy,  antepartum     Chronic hypertension affecting pregnancy     Overview Addendum 12/23/2019  9:43 AM by [name], MD    Initial visit:  Discussed initiation of ASA at [redacted] wks gestation   Will return next wek for BP check, consider initation of antihypertensive if needed    Baseline labs.  11/25/2019 Started procardia XL 30 mg q day 12/23/2019 Increased procardia XL to 60 mg q day 03/12/2020        Pt will restart Procardia when BP trends up, as has been off for 3 weeks since most home checks are 110-120s/70s    Anes report reviewed, repeat concsult 32-34 weeks PNV Glucola today Obesity and cHTN risk factors d/w pt [place] results (from MFM today) d/w pt   Annamarie Major, MD, [facility] 03/12/2020  2:28 PM

## 2020-03-13 LAB — 28 WEEK RH+PANEL
Basophils Absolute: 0 10*3/uL (ref 0.0–0.2)
Basos: 0 %
EOS (ABSOLUTE): 0.1 10*3/uL (ref 0.0–0.4)
Eos: 1 %
Gestational Diabetes Screen: 183 mg/dL — ABNORMAL HIGH (ref 65–139)
HIV Screen 4th Generation wRfx: NONREACTIVE
Hematocrit: 37.5 % (ref 34.0–46.6)
Hemoglobin: 11.7 g/dL (ref 11.1–15.9)
Immature Grans (Abs): 0 10*3/uL (ref 0.0–0.1)
Immature Granulocytes: 1 %
Lymphocytes Absolute: 1.8 10*3/uL (ref 0.7–3.1)
Lymphs: 23 %
MCH: 23.4 pg — ABNORMAL LOW (ref 26.6–33.0)
MCHC: 31.2 g/dL — ABNORMAL LOW (ref 31.5–35.7)
MCV: 75 fL — ABNORMAL LOW (ref 79–97)
Monocytes Absolute: 0.4 10*3/uL (ref 0.1–0.9)
Monocytes: 5 %
Neutrophils Absolute: 5.7 10*3/uL (ref 1.4–7.0)
Neutrophils: 70 %
Platelets: 270 10*3/uL (ref 150–450)
RBC: 5.01 x10E6/uL (ref 3.77–5.28)
RDW: 14.8 % (ref 11.7–15.4)
RPR Ser Ql: NONREACTIVE
WBC: 8.1 10*3/uL (ref 3.4–10.8)

## 2020-03-18 ENCOUNTER — Other Ambulatory Visit: Payer: Self-pay | Admitting: Obstetrics and Gynecology

## 2020-03-18 DIAGNOSIS — O99810 Abnormal glucose complicating pregnancy: Secondary | ICD-10-CM

## 2020-03-18 DIAGNOSIS — O0993 Supervision of high risk pregnancy, unspecified, third trimester: Secondary | ICD-10-CM

## 2020-03-18 NOTE — Progress Notes (Signed)
It looks like this patient saw she had failed the 1 hour gtt. I'll put in the order for the 3 hour gtt.

## 2020-03-26 ENCOUNTER — Other Ambulatory Visit: Payer: Medicaid Other

## 2020-03-26 ENCOUNTER — Other Ambulatory Visit: Payer: Self-pay

## 2020-03-26 ENCOUNTER — Ambulatory Visit (INDEPENDENT_AMBULATORY_CARE_PROVIDER_SITE_OTHER): Payer: Medicaid Other | Admitting: Certified Nurse Midwife

## 2020-03-26 ENCOUNTER — Encounter: Payer: Medicaid Other | Admitting: Obstetrics and Gynecology

## 2020-03-26 VITALS — BP 134/76 | Wt 358.0 lb

## 2020-03-26 DIAGNOSIS — O0993 Supervision of high risk pregnancy, unspecified, third trimester: Secondary | ICD-10-CM | POA: Diagnosis not present

## 2020-03-26 DIAGNOSIS — O99810 Abnormal glucose complicating pregnancy: Secondary | ICD-10-CM

## 2020-03-26 DIAGNOSIS — Z3A28 28 weeks gestation of pregnancy: Secondary | ICD-10-CM

## 2020-03-26 DIAGNOSIS — O10919 Unspecified pre-existing hypertension complicating pregnancy, unspecified trimester: Secondary | ICD-10-CM

## 2020-03-26 DIAGNOSIS — Z6841 Body Mass Index (BMI) 40.0 and over, adult: Secondary | ICD-10-CM

## 2020-03-26 LAB — POCT URINALYSIS DIPSTICK OB: POC,PROTEIN,UA: NEGATIVE

## 2020-03-26 NOTE — Progress Notes (Signed)
C/o pt takes 81mg  ASA BID to try to keep the swelling down.rj

## 2020-03-27 ENCOUNTER — Telehealth: Payer: Self-pay | Admitting: Certified Nurse Midwife

## 2020-03-27 DIAGNOSIS — O24419 Gestational diabetes mellitus in pregnancy, unspecified control: Secondary | ICD-10-CM

## 2020-03-27 DIAGNOSIS — O0993 Supervision of high risk pregnancy, unspecified, third trimester: Secondary | ICD-10-CM

## 2020-03-27 LAB — CMP14+EGFR
ALT: 15 IU/L (ref 0–32)
AST: 19 IU/L (ref 0–40)
Albumin/Globulin Ratio: 1.6 (ref 1.2–2.2)
Albumin: 3.6 g/dL — ABNORMAL LOW (ref 3.9–5.0)
Alkaline Phosphatase: 116 IU/L (ref 48–121)
BUN/Creatinine Ratio: 12 (ref 9–23)
BUN: 8 mg/dL (ref 6–20)
Bilirubin Total: <0.2 mg/dL (ref 0.0–1.2)
CO2: 20 mmol/L (ref 20–29)
Calcium: 8.9 mg/dL (ref 8.7–10.2)
Chloride: 102 mmol/L (ref 96–106)
Creatinine, Ser: 0.65 mg/dL (ref 0.57–1.00)
GFR calc Af Amer: 138 mL/min/{1.73_m2} (ref >59)
GFR calc non Af Amer: 120 mL/min/{1.73_m2} (ref >59)
Globulin, Total: 2.3 g/dL (ref 1.5–4.5)
Glucose: 190 mg/dL — ABNORMAL HIGH (ref 65–99)
Potassium: 3.8 mmol/L (ref 3.5–5.2)
Sodium: 136 mmol/L (ref 134–144)
Total Protein: 5.9 g/dL — ABNORMAL LOW (ref 6.0–8.5)

## 2020-03-27 LAB — PROTEIN / CREATININE RATIO, URINE
Creatinine, Urine: 191.4 mg/dL
Protein, Ur: 16.2 mg/dL
Protein/Creat Ratio: 85 mg/g creat (ref 0–200)

## 2020-03-27 LAB — GESTATIONAL GLUCOSE TOLERANCE
Glucose, Fasting: 104 mg/dL — ABNORMAL HIGH (ref 65–94)
Glucose, GTT - 1 Hour: 209 mg/dL — ABNORMAL HIGH (ref 65–179)
Glucose, GTT - 2 Hour: 183 mg/dL — ABNORMAL HIGH (ref 65–154)
Glucose, GTT - 3 Hour: 102 mg/dL (ref 65–139)

## 2020-03-27 NOTE — Telephone Encounter (Signed)
PAtient called with results of labs. Three of four glucose levels elevated diagnosing GDM. Will refer to Lifestyles for instruction in CHO restricted diet and in checking blood sugars. Explained reasons for glucose control and increased risks to her and fetus with uncontrolled blood sugars. Chemistry panel otherwise WNL. PC ratio 85 mgm. Follow up as scheduled. [name], CNM

## 2020-03-28 ENCOUNTER — Other Ambulatory Visit: Payer: Self-pay | Admitting: Certified Nurse Midwife

## 2020-03-30 NOTE — Progress Notes (Signed)
HROB at 28wk2d: Reports restarting procardia 60 mgm daily about 2 weeks ago. Her blood pressures at home were running 135-140 systolic. Denies headache, visual changes, RUQ pain. Has been taking 2 ASA 81 mgm on days when she was more swollen-she thought that it helped reduce edema.  Baby active. Had an elevated 1 hour GTT and is doing her 3 hour GTT today. Needs consult with Dr Priscella Mann, anesthesiologist, again at 32-34 weeks [place] by MFM  7/15 with EFW at 49%. Has another growth scan scheduled 8/12  Exam: BP 134/76, negative proteinuria, FH 34 cm. FHT 140. Weight 358# (up 5#)  A: IUP at 28wk2d with CHTN on Procardia 60 mgm daily Morbid obesity with BMI 57.78 kg/m2  P: 3 hour GTT today. Will also add CMP and PC ratio ROB in 2 weeks MFM growth scan in 2 weeks Wants to breast feed Schedule appointment with Dr Priscella Mann in 4 weeks Advised to take ASA 81 mgm daily  [name], CNM

## 2020-03-31 ENCOUNTER — Telehealth: Payer: Self-pay

## 2020-03-31 ENCOUNTER — Other Ambulatory Visit: Payer: Self-pay | Admitting: Obstetrics and Gynecology

## 2020-03-31 NOTE — Telephone Encounter (Signed)
-----   Message from Conard Novak, MD sent at 03/31/2020  9:19 AM EDT ----- It looks like she has gestational diabetes. Please call her and let her know. We'll send her to diabetes education. I'll put in the referral.

## 2020-03-31 NOTE — Telephone Encounter (Signed)
Pt aware.

## 2020-03-31 NOTE — Progress Notes (Signed)
It looks like she has gestational diabetes. Please call her and let her know. We'll send her to diabetes education. I'll put in the referral.

## 2020-04-02 ENCOUNTER — Other Ambulatory Visit: Payer: Self-pay

## 2020-04-02 ENCOUNTER — Encounter: Payer: Self-pay | Admitting: *Deleted

## 2020-04-02 ENCOUNTER — Telehealth: Payer: Self-pay

## 2020-04-02 ENCOUNTER — Other Ambulatory Visit: Payer: Self-pay | Admitting: Advanced Practice Midwife

## 2020-04-02 ENCOUNTER — Encounter: Payer: Medicaid Other | Attending: Obstetrics and Gynecology | Admitting: *Deleted

## 2020-04-02 VITALS — BP 130/80 | Ht 66.0 in | Wt 357.2 lb

## 2020-04-02 DIAGNOSIS — Z3A32 32 weeks gestation of pregnancy: Secondary | ICD-10-CM | POA: Insufficient documentation

## 2020-04-02 DIAGNOSIS — O99213 Obesity complicating pregnancy, third trimester: Secondary | ICD-10-CM | POA: Diagnosis not present

## 2020-04-02 DIAGNOSIS — O24410 Gestational diabetes mellitus in pregnancy, diet controlled: Secondary | ICD-10-CM

## 2020-04-02 DIAGNOSIS — O24419 Gestational diabetes mellitus in pregnancy, unspecified control: Secondary | ICD-10-CM | POA: Diagnosis not present

## 2020-04-02 DIAGNOSIS — O10913 Unspecified pre-existing hypertension complicating pregnancy, third trimester: Secondary | ICD-10-CM | POA: Insufficient documentation

## 2020-04-02 MED ORDER — ACCU-CHEK SOFTCLIX LANCETS MISC: 12 refills | Status: DC

## 2020-04-02 MED ORDER — ACCU-CHEK GUIDE VI STRP: ORAL_STRIP | 12 refills | Status: DC

## 2020-04-02 NOTE — Telephone Encounter (Signed)
Pt calling; needs rx for accuchek guide strips and accuchek softclix lancets.   [facility] [address]  [phone]

## 2020-04-02 NOTE — Progress Notes (Signed)
Diabetes Self-Management Education  Visit Type: First/Initial  Appt. Start Time: 1025 Appt. End Time: 1210  04/02/2020  Kelly Stevens, identified by name and date of birth, is a 29 y.o. female with a diagnosis of Diabetes: Gestational Diabetes.   ASSESSMENT  Blood pressure 130/80, height 5\' 6"  (1.676 m), weight (!) 357 lb 3.2 oz (162 kg), last menstrual period 09/10/2019, estimated date of delivery 06/16/2020 Body mass index is 57.65 kg/m.   Diabetes Self-Management Education - 04/02/20 1645      Visit Information   Visit Type First/Initial      Initial Visit   Diabetes Type Gestational Diabetes    Are you currently following a meal plan? Yes    What type of meal plan do you follow? "eating smaller meals more frequently, more calcium consumption"    Are you taking your medications as prescribed? Yes    Date Diagnosed 1 week ago      Health Coping   How would you rate your overall health? Good      Psychosocial Assessment   Patient Belief/Attitude about Diabetes Other (comment)   "stressed, worried"   Self-care barriers None    Self-management support Doctor's office;Family    Patient Concerns Nutrition/Meal planning;Glycemic Control;Monitoring;Weight Control;Healthy Lifestyle    Special Needs None    Preferred Learning Style Auditory;Hands on    Learning Readiness Change in progress    How often do you need to have someone help you when you read instructions, pamphlets, or other written materials from your doctor or pharmacy? 1 - Never    What is the last grade level you completed in school? some college      Pre-Education Assessment   Patient understands the diabetes disease and treatment process. Needs Instruction    Patient understands incorporating nutritional management into lifestyle. Needs Instruction    Patient undertands incorporating physical activity into lifestyle. Needs Instruction    Patient understands using medications safely. Needs Instruction     Patient understands monitoring blood glucose, interpreting and using results Needs Instruction    Patient understands prevention, detection, and treatment of acute complications. Needs Instruction    Patient understands prevention, detection, and treatment of chronic complications. Needs Instruction    Patient understands how to develop strategies to address psychosocial issues. Needs Instruction    Patient understands how to develop strategies to promote health/change behavior. Needs Instruction      Complications   How often do you check your blood sugar? 0 times/day (not testing)   Provided Accu-Chek Guide Me meter and instructed on use. BG upon return demonstration was 119 mg/dL at 06/02/20 noon - 2 hrs pp.   Have you had a dilated eye exam in the past 12 months? No    Have you had a dental exam in the past 12 months? Yes    Are you checking your feet? Yes    How many days per week are you checking your feet? 1      Dietary Intake   Breakfast eats peanut butter crackers at 6 am    Snack (morning) 10 am - another pack of peanut butter crackers; sometimes cornflakes and milk    Lunch chicken or cheese sandwich; eats fruit of cheeries, grapes, apples, strawberries    Dinner sandwich or chicken; occasional beef, fish or pork; potatoes, leafy greens, broccoli, brussels sprouts, tomatoes, cuccumbers, peppers    Beverage(s) water, cranberry juice      Exercise   Exercise Type Light (walking / raking leaves)  How many days per week to you exercise? 3.5    How many minutes per day do you exercise? 60    Total minutes per week of exercise 210      Patient Education   Previous Diabetes Education No    Disease state  Definition of diabetes, type 1 and 2, and the diagnosis of diabetes;Factors that contribute to the development of diabetes    Nutrition management  Role of diet in the treatment of diabetes and the relationship between the three main macronutrients and blood glucose level;Food label  reading, portion sizes and measuring food.;Reviewed blood glucose goals for pre and post meals and how to evaluate the patients' food intake on their blood glucose level.    Physical activity and exercise  Role of exercise on diabetes management, blood pressure control and cardiac health.    Medications Other (comment)   Limited use of oral medications during pregnancy and potential for insulin.   Monitoring Taught/evaluated SMBG meter.;Purpose and frequency of SMBG.;Taught/discussed recording of test results and interpretation of SMBG.;Identified appropriate SMBG and/or A1C goals.;Ketone testing, when, how.    Chronic complications Relationship between chronic complications and blood glucose control    Psychosocial adjustment Identified and addressed patients feelings and concerns about diabetes    Preconception care Pregnancy and GDM  Role of pre-pregnancy blood glucose control on the development of the fetus;Reviewed with patient blood glucose goals with pregnancy;Role of family planning for patients with diabetes      Individualized Goals (developed by patient)   Reducing Risk Other (comment)   improve blood sugars, prevent diabetes complications, lose weight, lead a healthier lifestyle, become more fit     Outcomes   Expected Outcomes Demonstrated interest in learning. Expect positive outcomes           Individualized Plan for Diabetes Self-Management Training:   Learning Objective:  Patient will have a greater understanding of diabetes self-management. Patient education plan is to attend individual and/or group sessions per assessed needs and concerns.   Plan:   Patient Instructions  Read booklet on Gestational Diabetes Follow Gestational Meal Planning Guidelines Avoid fruit juices and drink more water Avoid cold cereal for breakfast Don't skip meals Complete a 3 Day Food Record and bring to next appointment Check blood sugars 4 x day - before breakfast and 2 hrs after every  meal and record  Bring blood sugar log to all appointments Call MD for prescription for meter strips and lancets Strips   Accu-Chek Guide Lancets   Accu-Chek Softclix Purchase urine ketone strips if instructed by MD and check urine ketones every am:  If + increase bedtime snack to 1 protein and 2 carbohydrate servings Walk 20-30 minutes at least 5 x week if permitted by MD  Expected Outcomes:  Demonstrated interest in learning. Expect positive outcomes  Education material provided:  Gestational Booklet Gestational Meal Planning Guidelines Simple Meal Plan Viewed Gestational Diabetes Video Meter - Accu-Chek Guide Me 3 Day Food Record Goals for a Healthy Pregnancy  If problems or questions, patient to contact team via:  Sharion Settler, RN, CCM, CDCES [phone]  Future DSME appointment:  April 09, 2020 with the dietitian

## 2020-04-02 NOTE — Progress Notes (Signed)
Rx Test strip and lancets refilled.

## 2020-04-02 NOTE — Patient Instructions (Signed)
Read booklet on Gestational Diabetes Follow Gestational Meal Planning Guidelines Avoid fruit juices and drink more water Avoid cold cereal for breakfast Don't skip meals Complete a 3 Day Food Record and bring to next appointment Check blood sugars 4 x day - before breakfast and 2 hrs after every meal and record  Bring blood sugar log to all appointments Call MD for prescription for meter strips and lancets Strips   Accu-Chek Guide Lancets   Accu-Chek Softclix Purchase urine ketone strips if instructed by MD and check urine ketones every am:  If + increase bedtime snack to 1 protein and 2 carbohydrate servings Walk 20-30 minutes at least 5 x week if permitted by MD

## 2020-04-02 NOTE — Telephone Encounter (Signed)
Supplies ordered.

## 2020-04-03 NOTE — Telephone Encounter (Signed)
Pt aware.

## 2020-04-06 ENCOUNTER — Other Ambulatory Visit: Payer: Self-pay | Admitting: Obstetrics and Gynecology

## 2020-04-06 DIAGNOSIS — O99210 Obesity complicating pregnancy, unspecified trimester: Secondary | ICD-10-CM

## 2020-04-06 DIAGNOSIS — O0992 Supervision of high risk pregnancy, unspecified, second trimester: Secondary | ICD-10-CM

## 2020-04-09 ENCOUNTER — Encounter: Payer: Self-pay | Admitting: Obstetrics and Gynecology

## 2020-04-09 ENCOUNTER — Other Ambulatory Visit: Payer: Self-pay

## 2020-04-09 ENCOUNTER — Ambulatory Visit: Payer: Medicaid Other | Attending: Obstetrics

## 2020-04-09 ENCOUNTER — Encounter: Payer: Self-pay | Admitting: Dietician

## 2020-04-09 ENCOUNTER — Encounter: Payer: Medicaid Other | Admitting: Dietician

## 2020-04-09 ENCOUNTER — Ambulatory Visit (INDEPENDENT_AMBULATORY_CARE_PROVIDER_SITE_OTHER): Payer: Medicaid Other | Admitting: Obstetrics and Gynecology

## 2020-04-09 VITALS — Ht 66.0 in | Wt 356.6 lb

## 2020-04-09 VITALS — BP 144/82 | HR 108 | Wt 357.0 lb

## 2020-04-09 DIAGNOSIS — O24419 Gestational diabetes mellitus in pregnancy, unspecified control: Secondary | ICD-10-CM | POA: Insufficient documentation

## 2020-04-09 DIAGNOSIS — O0992 Supervision of high risk pregnancy, unspecified, second trimester: Secondary | ICD-10-CM

## 2020-04-09 DIAGNOSIS — O10919 Unspecified pre-existing hypertension complicating pregnancy, unspecified trimester: Secondary | ICD-10-CM

## 2020-04-09 DIAGNOSIS — O0993 Supervision of high risk pregnancy, unspecified, third trimester: Secondary | ICD-10-CM

## 2020-04-09 DIAGNOSIS — E669 Obesity, unspecified: Secondary | ICD-10-CM | POA: Insufficient documentation

## 2020-04-09 DIAGNOSIS — O24410 Gestational diabetes mellitus in pregnancy, diet controlled: Secondary | ICD-10-CM

## 2020-04-09 DIAGNOSIS — O99213 Obesity complicating pregnancy, third trimester: Secondary | ICD-10-CM

## 2020-04-09 DIAGNOSIS — Z23 Encounter for immunization: Secondary | ICD-10-CM | POA: Diagnosis not present

## 2020-04-09 DIAGNOSIS — Z3A30 30 weeks gestation of pregnancy: Secondary | ICD-10-CM

## 2020-04-09 DIAGNOSIS — O10013 Pre-existing essential hypertension complicating pregnancy, third trimester: Secondary | ICD-10-CM | POA: Diagnosis not present

## 2020-04-09 DIAGNOSIS — O99210 Obesity complicating pregnancy, unspecified trimester: Secondary | ICD-10-CM

## 2020-04-09 DIAGNOSIS — O10913 Unspecified pre-existing hypertension complicating pregnancy, third trimester: Secondary | ICD-10-CM

## 2020-04-09 DIAGNOSIS — Z6841 Body Mass Index (BMI) 40.0 and over, adult: Secondary | ICD-10-CM

## 2020-04-09 LAB — POCT URINALYSIS DIPSTICK OB: Glucose, UA: NEGATIVE

## 2020-04-09 NOTE — Progress Notes (Signed)
Routine Prenatal Care Visit  Subjective  Kelly Stevens is a 29 y.o. G4P0030 at [redacted]w[redacted]d being seen today for ongoing prenatal care.  She is currently monitored for the following issues for this high-risk pregnancy and has Family history of breast cancer; Supervision of high-risk pregnancy; Obesity in pregnancy, antepartum; Chronic hypertension affecting pregnancy; BMI 50.0-59.9, adult (HCC); and Gestational diabetes mellitus (GDM) in third trimester on their problem list.  ----------------------------------------------------------------------------------- Patient reports no complaints.   Contractions: Not present. Vag. Bleeding: None.  Movement: Present. Leaking Fluid denies.  U/S with [facility] today: U/S today 74th %ile, AC 86th %ile,  Saw dietician. States that her fasting levels are a little high, postprandial levels mostly normal with rare elevated excursions. She appears to be doing a good job of incorporating lifestyle modifications and dietary modifications to optimize her BG values.   ----------------------------------------------------------------------------------- The following portions of the patient's history were reviewed and updated as appropriate: allergies, current medications, past family history, past medical history, past social history, past surgical history and problem list. Problem list updated.  Objective  Blood pressure (!) 144/82, pulse (!) 108, weight (!) 357 lb (161.9 kg), last menstrual period 09/10/2019. Pregravid weight 336 lb (152.4 kg) Total Weight Gain 21 lb (9.526 kg) Urinalysis: Urine Protein Moderate (2+)  Urine Glucose Negative  Fetal Status: Fetal Heart Rate (bpm): 147 ([place])   Movement: Present     General:  Alert, oriented and cooperative. Patient is in no acute distress.  Skin: Skin is warm and dry. No rash noted.   Cardiovascular: Normal heart rate noted  Respiratory: Normal respiratory effort, no problems with respiration noted  Abdomen: Soft, gravid,  appropriate for gestational age. Pain/Pressure: Absent     Pelvic:  Cervical exam deferred        Extremities: Normal range of motion.     Mental Status: Normal mood and affect. Normal behavior. Normal judgment and thought content.   See U/S report from MFM today.  Assessment   29 y.o. G4P0030 at [redacted]w[redacted]d by  06/16/2020, by Last Menstrual Period presenting for routine prenatal visit  Plan   Pregnancy #4 Problems (from 11/04/19 to present)    Problem Noted Resolved   BMI 50.0-59.9, adult (HCC) 11/11/2019 by Conard Novak, MD No   Supervision of high-risk pregnancy 11/04/2019 by [name], MD No   Overview Addendum 03/27/2020  3:13 PM by [name], CNM     Nursing Staff Provider  Office Location  [facility] Dating   LMP=5wk [place]  Language  English Anatomy US    Flu Vaccine  11/04/2019 Genetic Screen  NIPS: Normal XX  TDaP vaccine    Hgb A1C or  GTT Early : 105 Third trimester : 183; 3hr GTT 104/ 209/ 183/ 102  Rhogam   not needed   LAB RESULTS   Feeding Plan  Blood Type B/Positive/-- (03/08 1054)   Contraception  Antibody Negative (03/08 1054)  Circumcision  Rubella <0.90 (03/08 1054) Non-immune  Pediatrician   RPR Non Reactive (03/08 1054)   Support Person  HBsAg Negative (03/08 1054)   Prenatal Classes  HIV Non Reactive (03/08 1054)    Varicella  Immune  BTL Consent  GBS  (For PCN allergy, check sensitivities)        VBAC Consent  Pap  2020 NIL    Hgb Electro   normal    CF      SMA               Previous Version  Obesity in pregnancy, antepartum 11/04/2019 by [name], MD No   Chronic hypertension affecting pregnancy 11/04/2019 by [name], MD No   Overview Addendum 03/26/2020  9:28 AM by [name], CNM    Initial visit:  Discussed initiation of ASA at [redacted] wks gestation   Will return next wek for BP check, consider initation of antihypertensive if needed     Baseline labs.   11/25/2019 Started procardia XL 30 mg q  day 12/23/2019 Increased procardia XL to 60 mg q day  [ ]  Aspirin 81 mg daily after 12 weeks; discontinue after 36 weeks [ ]  baseline labs with CBC, CMP, urine protein/creatinine ratio [ ]  no BP meds unless BPs become elevated [ ]  ultrasound for growth at 28, 32, 36 weeks [ ]  Aspirin 81 mg daily after 12 weeks; discontinue after 36 weeks [ ]  Baseline EKG   Current antihypertensives:  Procardia XL   Baseline and surveillance labs (pulled in from [facility], refresh links as needed)  Lab Results  Component Value Date   PLT 270 03/12/2020   CREATININE 0.86 11/25/2019   AST 17 11/25/2019   ALT 12 11/25/2019    Antenatal Testing CHTN - O10.919  Group I  BP < 140/90, no preeclampsia, AGA,  nml AFV, +/- meds    Group II BP > 140/90, on meds, no preeclampsia, AGA, nml AFV  20-28-34-38  20-24-28-32-35-38  32//2 x wk  28//BPP wkly then 32//2 x wk  40 no meds; 39 meds  PRN or 37  Pre-eclampsia  GHTN - O13.9/Preeclampsia without severe features  - O14.00   Preeclampsia with severe features - O14.10  Q 3-4wks  Q 2 wks  28//BPP wkly then 32//2 x wk  Inpatient  37  PRN or 34        Previous Version       Preterm labor symptoms and general obstetric precautions including but not limited to vaginal bleeding, contractions, leaking of fluid and fetal movement were reviewed in detail with the patient. Please refer to After Visit Summary for other counseling recommendations.   - Follow up one week to check BG log and decide about therapy. - Continue current antihypertensive regimen. - start APT at 32 weeks. -TDaP today  Return in about 1 week (around 04/16/2020) for Routine Prenatal Appointment.  11/27/2019, MD, 11/27/2019 OB/GYN, [facility] 04/09/2020 12:53 PM

## 2020-04-09 NOTE — Progress Notes (Signed)
.   Patient's BG record indicates fasting BGs ranging 96-109 , and post-meal BGs ranging 103-135 . Patient's food diary indicates pt has incorporated many of the recommendations for carb control; pt needs to include protein at snacks and continue to limit high fat foods (pizza, fries) and sugar sweetened beverages (tea and lemonade) . Provided individualized menus based on patient's food preferences . Instructed patient on food safety, including avoidance of Listeriosis, and limiting mercury from fish. . Discussed importance of maintaining healthy lifestyle habits to reduce risk of Type 2 DM as well as Gestational DM with any future pregnancies. . Advised patient to use any remaining testing supplies to test some BGs after delivery, and to have BG tested ideally annually, as well as prior to attempting future pregnancies.

## 2020-04-16 ENCOUNTER — Encounter: Payer: Self-pay | Admitting: Obstetrics and Gynecology

## 2020-04-16 ENCOUNTER — Other Ambulatory Visit: Payer: Self-pay

## 2020-04-16 ENCOUNTER — Ambulatory Visit (INDEPENDENT_AMBULATORY_CARE_PROVIDER_SITE_OTHER): Payer: Medicaid Other | Admitting: Obstetrics and Gynecology

## 2020-04-16 VITALS — BP 129/81 | Wt 355.0 lb

## 2020-04-16 DIAGNOSIS — O10913 Unspecified pre-existing hypertension complicating pregnancy, third trimester: Secondary | ICD-10-CM

## 2020-04-16 DIAGNOSIS — O0993 Supervision of high risk pregnancy, unspecified, third trimester: Secondary | ICD-10-CM

## 2020-04-16 DIAGNOSIS — O24410 Gestational diabetes mellitus in pregnancy, diet controlled: Secondary | ICD-10-CM

## 2020-04-16 DIAGNOSIS — O99213 Obesity complicating pregnancy, third trimester: Secondary | ICD-10-CM

## 2020-04-16 DIAGNOSIS — O10919 Unspecified pre-existing hypertension complicating pregnancy, unspecified trimester: Secondary | ICD-10-CM

## 2020-04-16 DIAGNOSIS — O99210 Obesity complicating pregnancy, unspecified trimester: Secondary | ICD-10-CM

## 2020-04-16 DIAGNOSIS — Z3A31 31 weeks gestation of pregnancy: Secondary | ICD-10-CM

## 2020-04-16 NOTE — Progress Notes (Signed)
Routine Prenatal Care Visit  Subjective  Kelly Stevens is a 29 y.o. G4P0030 at [redacted]w[redacted]d being seen today for ongoing prenatal care.  She is currently monitored for the following issues for this high-risk pregnancy and has Family history of breast cancer; Supervision of high-risk pregnancy; Obesity in pregnancy, antepartum; Chronic hypertension affecting pregnancy; BMI 50.0-59.9, adult (HCC); and Gestational diabetes mellitus (GDM) in third trimester on their problem list.  ----------------------------------------------------------------------------------- Patient reports no complaints.   Contractions: Not present. Vag. Bleeding: None.  Movement: Present. Leaking Fluid denies.   She brings a full BG log today. Mostly her postprandial numbers are normal.  Her fasting values are borderline with a couple at 96.  I will give her another week to figure out her fasting numbers. However, we will need to start a low-dose of insulin, if they are still elevated at her next visit.     ----------------------------------------------------------------------------------- The following portions of the patient's history were reviewed and updated as appropriate: allergies, current medications, past family history, past medical history, past social history, past surgical history and problem list. Problem list updated.  Objective  Blood pressure 129/81, weight (!) 355 lb (161 kg), last menstrual period 09/10/2019. Pregravid weight 336 lb (152.4 kg) Total Weight Gain 19 lb (8.618 kg) Urinalysis: Urine Protein    Urine Glucose    Fetal Status: Fetal Heart Rate (bpm): 140   Movement: Present     General:  Alert, oriented and cooperative. Patient is in no acute distress.  Skin: Skin is warm and dry. No rash noted.   Cardiovascular: Normal heart rate noted  Respiratory: Normal respiratory effort, no problems with respiration noted  Abdomen: Soft, gravid, appropriate for gestational age. Pain/Pressure: Absent      Pelvic:  Cervical exam deferred        Extremities: Normal range of motion.     Mental Status: Normal mood and affect. Normal behavior. Normal judgment and thought content.   Assessment   29 y.o. G4P0030 at [redacted]w[redacted]d by  06/16/2020, by Last Menstrual Period presenting for routine prenatal visit  Plan   Pregnancy #4 Problems (from 11/04/19 to present)    Problem Noted Resolved   BMI 50.0-59.9, adult (HCC) 11/11/2019 by Conard Novak, MD No   Supervision of high-risk pregnancy 11/04/2019 by [name], MD No   Overview Addendum 03/27/2020  3:13 PM by [name], CNM     Nursing Staff Provider  Office Location  [facility] Dating   LMP=5wk [place]  Language  English Anatomy US    Flu Vaccine  11/04/2019 Genetic Screen  NIPS: Normal XX  TDaP vaccine    Hgb A1C or  GTT Early : 105 Third trimester : 183; 3hr GTT 104/ 209/ 183/ 102  Rhogam   not needed   LAB RESULTS   Feeding Plan  Blood Type B/Positive/-- (03/08 1054)   Contraception  Antibody Negative (03/08 1054)  Circumcision  Rubella <0.90 (03/08 1054) Non-immune  Pediatrician   RPR Non Reactive (03/08 1054)   Support Person  HBsAg Negative (03/08 1054)   Prenatal Classes  HIV Non Reactive (03/08 1054)    Varicella  Immune  BTL Consent  GBS  (For PCN allergy, check sensitivities)        VBAC Consent  Pap  2020 NIL    Hgb Electro   normal    CF      SMA               Previous Version   Obesity  in pregnancy, antepartum 11/04/2019 by [name], MD No   Chronic hypertension affecting pregnancy 11/04/2019 by [name], MD No   Overview Addendum 03/26/2020  9:28 AM by [name], CNM    Initial visit:  Discussed initiation of ASA at [redacted] wks gestation   Will return next wek for BP check, consider initation of antihypertensive if needed     Baseline labs.   11/25/2019 Started procardia XL 30 mg q day 12/23/2019 Increased procardia XL to 60 mg q day  [ ]  Aspirin 81 mg daily after 12 weeks;  discontinue after 36 weeks [ ]  baseline labs with CBC, CMP, urine protein/creatinine ratio [ ]  no BP meds unless BPs become elevated [ ]  ultrasound for growth at 28, 32, 36 weeks [ ]  Aspirin 81 mg daily after 12 weeks; discontinue after 36 weeks [ ]  Baseline EKG   Current antihypertensives:  Procardia XL   Baseline and surveillance labs (pulled in from [facility], refresh links as needed)  Lab Results  Component Value Date   PLT 270 03/12/2020   CREATININE 0.86 11/25/2019   AST 17 11/25/2019   ALT 12 11/25/2019    Antenatal Testing CHTN - O10.919  Group I  BP < 140/90, no preeclampsia, AGA,  nml AFV, +/- meds    Group II BP > 140/90, on meds, no preeclampsia, AGA, nml AFV  20-28-34-38  20-24-28-32-35-38  32//2 x wk  28//BPP wkly then 32//2 x wk  40 no meds; 39 meds  PRN or 37  Pre-eclampsia  GHTN - O13.9/Preeclampsia without severe features  - O14.00   Preeclampsia with severe features - O14.10  Q 3-4wks  Q 2 wks  28//BPP wkly then 32//2 x wk  Inpatient  37  PRN or 34        Previous Version       Preterm labor symptoms and general obstetric precautions including but not limited to vaginal bleeding, contractions, leaking of fluid and fetal movement were reviewed in detail with the patient. Please refer to After Visit Summary for other counseling recommendations.   Return in about 1 week (around 04/23/2020) for Routine prenatal with NST.  11/27/2019, MD, 11/27/2019 OB/GYN, [facility] 04/16/2020 2:20 PM

## 2020-04-23 ENCOUNTER — Other Ambulatory Visit: Payer: Self-pay

## 2020-04-23 ENCOUNTER — Encounter: Payer: Self-pay | Admitting: Obstetrics and Gynecology

## 2020-04-23 ENCOUNTER — Ambulatory Visit (INDEPENDENT_AMBULATORY_CARE_PROVIDER_SITE_OTHER): Payer: Medicaid Other | Admitting: Obstetrics and Gynecology

## 2020-04-23 VITALS — BP 138/77 | Wt 352.0 lb

## 2020-04-23 DIAGNOSIS — O0993 Supervision of high risk pregnancy, unspecified, third trimester: Secondary | ICD-10-CM | POA: Diagnosis not present

## 2020-04-23 DIAGNOSIS — O99213 Obesity complicating pregnancy, third trimester: Secondary | ICD-10-CM

## 2020-04-23 DIAGNOSIS — O99210 Obesity complicating pregnancy, unspecified trimester: Secondary | ICD-10-CM

## 2020-04-23 DIAGNOSIS — O24414 Gestational diabetes mellitus in pregnancy, insulin controlled: Secondary | ICD-10-CM

## 2020-04-23 DIAGNOSIS — O10913 Unspecified pre-existing hypertension complicating pregnancy, third trimester: Secondary | ICD-10-CM

## 2020-04-23 DIAGNOSIS — O10919 Unspecified pre-existing hypertension complicating pregnancy, unspecified trimester: Secondary | ICD-10-CM

## 2020-04-23 DIAGNOSIS — Z6841 Body Mass Index (BMI) 40.0 and over, adult: Secondary | ICD-10-CM

## 2020-04-23 DIAGNOSIS — O24410 Gestational diabetes mellitus in pregnancy, diet controlled: Secondary | ICD-10-CM | POA: Diagnosis not present

## 2020-04-23 DIAGNOSIS — Z3A32 32 weeks gestation of pregnancy: Secondary | ICD-10-CM

## 2020-04-23 LAB — FETAL NONSTRESS TEST

## 2020-04-23 LAB — POCT URINALYSIS DIPSTICK OB: Glucose, UA: NEGATIVE

## 2020-04-23 NOTE — Progress Notes (Signed)
Routine Prenatal Care Visit  Subjective  Kelly Stevens is a 29 y.o. G4P0030 at [redacted]w[redacted]d being seen today for ongoing prenatal care.  She is currently monitored for the following issues for this high-risk pregnancy and has Family history of breast cancer; Supervision of high-risk pregnancy; Obesity in pregnancy, antepartum; Chronic hypertension affecting pregnancy; BMI 50.0-59.9, adult (HCC); and Gestational diabetes mellitus (GDM) in third trimester on their problem list.  ----------------------------------------------------------------------------------- Patient reports no complaints.   Contractions: Not present. Vag. Bleeding: None.  Movement: Present. Leaking Fluid denies.  CHTN: no issues. Taking nifedipine without difficulty GDMA2: most fasting values elevated, >30% dinner values elevated, though most are close to target.    ----------------------------------------------------------------------------------- The following portions of the patient's history were reviewed and updated as appropriate: allergies, current medications, past family history, past medical history, past social history, past surgical history and problem list. Problem list updated.  Objective  Blood pressure 138/77, weight (!) 352 lb (159.7 kg), last menstrual period 09/10/2019. Pregravid weight 336 lb (152.4 kg) Total Weight Gain 16 lb (7.258 kg) Urinalysis: Urine Protein Small (1+)  Urine Glucose Negative  Fetal Status: Fetal Heart Rate (bpm): 145   Movement: Present     General:  Alert, oriented and cooperative. Patient is in no acute distress.  Skin: Skin is warm and dry. No rash noted.   Cardiovascular: Normal heart rate noted  Respiratory: Normal respiratory effort, no problems with respiration noted  Abdomen: Soft, gravid, appropriate for gestational age. Pain/Pressure: Absent     Pelvic:  Cervical exam deferred        Extremities: Normal range of motion.     Mental Status: Normal mood and affect. Normal  behavior. Normal judgment and thought content.   NST: Baseline FHR: 145 beats/min Variability: moderate Accelerations: present Decelerations: absent Tocometry: not done  Interpretation:  INDICATIONS: gestational diabetes mellitus and chronic hypertension RESULTS:  A NST procedure was performed with FHR monitoring and a normal baseline established, appropriate time of 20-40 minutes of evaluation, and accels >2 seen w 15x15 characteristics.  Results show a REACTIVE NST.    Assessment   29 y.o. G4P0030 at [redacted]w[redacted]d by  06/16/2020, by Last Menstrual Period presenting for routine prenatal visit  Plan   Pregnancy #4 Problems (from 11/04/19 to present)    Problem Noted Resolved   BMI 50.0-59.9, adult (HCC) 11/11/2019 by Conard Novak, MD No   Supervision of high-risk pregnancy 11/04/2019 by [name], MD No   Overview Addendum 03/27/2020  3:13 PM by [name], CNM     Nursing Staff Provider  Office Location  [facility] Dating   LMP=5wk [place]  Language  English Anatomy US    Flu Vaccine  11/04/2019 Genetic Screen  NIPS: Normal XX  TDaP vaccine    Hgb A1C or  GTT Early : 105 Third trimester : 183; 3hr GTT 104/ 209/ 183/ 102  Rhogam   not needed   LAB RESULTS   Feeding Plan  Blood Type B/Positive/-- (03/08 1054)   Contraception  Antibody Negative (03/08 1054)  Circumcision  Rubella <0.90 (03/08 1054) Non-immune  Pediatrician   RPR Non Reactive (03/08 1054)   Support Person  HBsAg Negative (03/08 1054)   Prenatal Classes  HIV Non Reactive (03/08 1054)    Varicella  Immune  BTL Consent  GBS  (For PCN allergy, check sensitivities)        VBAC Consent  Pap  2020 NIL    Hgb Electro   normal    CF  SMA               Previous Version   Obesity in pregnancy, antepartum 11/04/2019 by [name], MD No   Chronic hypertension affecting pregnancy 11/04/2019 by [name], MD No   Overview Addendum 03/26/2020  9:28 AM by [name], CNM     Initial visit:  Discussed initiation of ASA at [redacted] wks gestation   Will return next wek for BP check, consider initation of antihypertensive if needed     Baseline labs.   11/25/2019 Started procardia XL 30 mg q day 12/23/2019 Increased procardia XL to 60 mg q day  [ ]  Aspirin 81 mg daily after 12 weeks; discontinue after 36 weeks [ ]  baseline labs with CBC, CMP, urine protein/creatinine ratio [ ]  no BP meds unless BPs become elevated [ ]  ultrasound for growth at 28, 32, 36 weeks [ ]  Aspirin 81 mg daily after 12 weeks; discontinue after 36 weeks [ ]  Baseline EKG   Current antihypertensives:  Procardia XL   Baseline and surveillance labs (pulled in from [facility], refresh links as needed)  Lab Results  Component Value Date   PLT 270 03/12/2020   CREATININE 0.86 11/25/2019   AST 17 11/25/2019   ALT 12 11/25/2019    Antenatal Testing CHTN - O10.919  Group I  BP < 140/90, no preeclampsia, AGA,  nml AFV, +/- meds    Group II BP > 140/90, on meds, no preeclampsia, AGA, nml AFV  20-28-34-38  20-24-28-32-35-38  32//2 x wk  28//BPP wkly then 32//2 x wk  40 no meds; 39 meds  PRN or 37  Pre-eclampsia  GHTN - O13.9/Preeclampsia without severe features  - O14.00   Preeclampsia with severe features - O14.10  Q 3-4wks  Q 2 wks  28//BPP wkly then 32//2 x wk  Inpatient  37  PRN or 34        Previous Version       Preterm labor symptoms and general obstetric precautions including but not limited to vaginal bleeding, contractions, leaking of fluid and fetal movement were reviewed in detail with the patient. Please refer to After Visit Summary for other counseling recommendations.   Rx insulin. Start 5 units HS. Referral to lifestyles for insulin admin teaching  Return in about 1 week (around 04/30/2020) for Routine Prenatal Appointment w/ NST.  11/27/2019, MD, 11/27/2019 OB/GYN, [facility] 04/23/2020 5:31 PM

## 2020-04-24 MED ORDER — INSULIN DETEMIR 100 UNIT/ML ~~LOC~~ SOLN: 5.0000 [IU] | Freq: Every day | SUBCUTANEOUS | 11 refills | Status: DC

## 2020-04-28 ENCOUNTER — Encounter: Payer: Medicaid Other | Admitting: *Deleted

## 2020-04-28 ENCOUNTER — Encounter: Payer: Self-pay | Admitting: *Deleted

## 2020-04-28 ENCOUNTER — Other Ambulatory Visit: Payer: Self-pay

## 2020-04-28 VITALS — BP 130/84 | Wt 358.7 lb

## 2020-04-28 DIAGNOSIS — O24414 Gestational diabetes mellitus in pregnancy, insulin controlled: Secondary | ICD-10-CM

## 2020-04-28 DIAGNOSIS — O24419 Gestational diabetes mellitus in pregnancy, unspecified control: Secondary | ICD-10-CM | POA: Diagnosis not present

## 2020-04-28 NOTE — Progress Notes (Signed)
Diabetes Self-Management Education  Visit Type: Follow-up  Appt. Start Time: 1535 Appt. End Time: 1645  04/28/2020  [name], identified by name and date of birth, is a 29 y.o. female with a diagnosis of Gestational Diabetes:  .   ASSESSMENT  Blood pressure 130/84, weight (!) 358 lb 11.2 oz (162.7 kg), last menstrual period 09/10/2019, estimated date of delivery 06/16/2020 Body mass index is 57.9 kg/m.   Diabetes Self-Management Education - 04/28/20 1600      Visit Information   Visit Type Follow-up      Complications   How often do you check your blood sugar? 3-4 times/day    Fasting Blood glucose range (mg/dL) 26-712   FBG's 45-809 mg/dL - 98/33 out of range   Postprandial Blood glucose range (mg/dL) 82-505;397-673   pp's 89-138 mg/dL - higher readings related to food choices     Dietary Intake   Breakfast 3 meals and 1 snack/day      Exercise   Exercise Type Light (walking / raking leaves)    How many days per week to you exercise? 7    How many minutes per day do you exercise? 15    Total minutes per week of exercise 105      Patient Education   Nutrition management  Role of diet in the treatment of diabetes and the relationship between the three main macronutrients and blood glucose level;Reviewed blood glucose goals for pre and post meals and how to evaluate the patients' food intake on their blood glucose level.    Physical activity and exercise  Role of exercise on diabetes management, blood pressure control and cardiac health.    Medications Taught/reviewed insulin injection, site rotation, insulin storage and needle disposal.;Reviewed patients medication for diabetes, action, purpose, timing of dose and side effects.   Pt injected 5 units NS to right abdomen subcutaneous via syringe without difficulty.   Monitoring Taught/discussed recording of test results and interpretation of SMBG.;Identified appropriate SMBG and/or A1C goals.    Acute complications  Taught treatment of hypoglycemia - the 15 rule.    Preconception care Reviewed with patient blood glucose goals with pregnancy;Role of family planning for patients with diabetes      Outcomes   Expected Outcomes Demonstrated interest in learning. Expect positive outcomes    Future DMSE PRN    Program Status Completed      Subsequent Visit   Since your last visit have you continued or begun to take your medications as prescribed? Yes    Since your last visit have you had your blood pressure checked? Yes    Is your most recent blood pressure lower, unchanged, or higher since your last visit? Unchanged    Since your last visit have you experienced any weight changes? Gain    Weight Gain (lbs) 1    Since your last visit, are you checking your blood glucose at least once a day? Yes           Individualized Plan for Diabetes Self-Management Training:   Learning Objective:  Patient will have a greater understanding of diabetes self-management. Patient education plan is to attend individual and/or group sessions per assessed needs and concerns.   Plan:   Patient Instructions     Follow Gestational Meal Planning Guidelines Check blood sugars 4 x day - before breakfast and 2 hrs after every meal and record  Bring blood sugar log to MD appointments Walk 20-30 minutes at least 5 x week if permitted  by MD Carry fast acting glucose and a snack at all times Rotate injection sites Levemir 5 units at bedtime  Expected Outcomes:  Demonstrated interest in learning. Expect positive outcomes  Education material provided:  Injection Guide (BD) Glucose tablets Symptoms, causes and treatments of Hypoglycemia  If problems or questions, patient to contact team via:  Sharion Settler, RN, CCM, CDCES [phone]  Future DSME appointment: PRN

## 2020-04-28 NOTE — Patient Instructions (Addendum)
  Follow Gestational Meal Planning Guidelines Check blood sugars 4 x day - before breakfast and 2 hrs after every meal and record  Bring blood sugar log to MD appointments  Walk 20-30 minutes at least 5 x week if permitted by MD  Carry fast acting glucose and a snack at all times Rotate injection sites  Levemir 5 units at bedtime

## 2020-04-29 ENCOUNTER — Encounter: Payer: Self-pay | Admitting: Advanced Practice Midwife

## 2020-04-29 ENCOUNTER — Ambulatory Visit (INDEPENDENT_AMBULATORY_CARE_PROVIDER_SITE_OTHER): Payer: Medicaid Other | Admitting: Advanced Practice Midwife

## 2020-04-29 VITALS — BP 139/85 | Wt 353.0 lb

## 2020-04-29 DIAGNOSIS — Z3A33 33 weeks gestation of pregnancy: Secondary | ICD-10-CM

## 2020-04-29 DIAGNOSIS — O0993 Supervision of high risk pregnancy, unspecified, third trimester: Secondary | ICD-10-CM | POA: Diagnosis not present

## 2020-04-29 DIAGNOSIS — O24414 Gestational diabetes mellitus in pregnancy, insulin controlled: Secondary | ICD-10-CM

## 2020-04-29 DIAGNOSIS — O10919 Unspecified pre-existing hypertension complicating pregnancy, unspecified trimester: Secondary | ICD-10-CM | POA: Diagnosis not present

## 2020-04-29 DIAGNOSIS — O99210 Obesity complicating pregnancy, unspecified trimester: Secondary | ICD-10-CM | POA: Diagnosis not present

## 2020-04-29 LAB — FETAL NONSTRESS TEST

## 2020-04-29 MED ORDER — "SYRINGE/NEEDLE (DISP) 25G X 5/8"" 1 ML MISC": 4 refills | Status: DC

## 2020-04-29 NOTE — Progress Notes (Signed)
Routine Prenatal Care Visit  Subjective  Kelly Stevens is a 29 y.o. G4P0030 at [redacted]w[redacted]d being seen today for ongoing prenatal care.  She is currently monitored for the following issues for this high-risk pregnancy and has Family history of breast cancer; Supervision of high-risk pregnancy; Obesity in pregnancy, antepartum; Chronic hypertension affecting pregnancy; BMI 50.0-59.9, adult (HCC); and Gestational diabetes mellitus (GDM) in third trimester on their problem list.  ----------------------------------------------------------------------------------- Patient reports no complaints.  We discussed breastfeeding basics and benefits. She had Lifestyles visit yesterday and started insulin last night. She has follow up with MFM on the 9th and Anesthesia on the 17th. Contractions: Not present. Vag. Bleeding: None.  Movement: Present. Leaking Fluid denies.  ----------------------------------------------------------------------------------- The following portions of the patient's history were reviewed and updated as appropriate: allergies, current medications, past family history, past medical history, past social history, past surgical history and problem list. Problem list updated.  Objective  Blood pressure 139/85, weight (!) 353 lb (160.1 kg), last menstrual period 09/10/2019. Pregravid weight 336 lb (152.4 kg) Total Weight Gain 17 lb (7.711 kg) Urinalysis: Urine Protein    Urine Glucose    Fetal Status: Fetal Heart Rate (bpm): 140   Movement: Present      NST: reactive 20 minute tracing, baseline 140 bpm, moderate variability, +accelerations, -decelerations  BS log: Fasting 4 out of 6 are elevated in upper 90s, after breakfast are normal, after lunch are normal, after dinner 2 out of 6 are elevated in upper 120s. First dose of insulin was last night and this morning's fasting level is 94.  General:  Alert, oriented and cooperative. Patient is in no acute distress.  Skin: Skin is warm and  dry. No rash noted.   Cardiovascular: Normal heart rate noted  Respiratory: Normal respiratory effort, no problems with respiration noted  Abdomen: Soft, gravid, appropriate for gestational age. Pain/Pressure: Absent     Pelvic:  Cervical exam deferred        Extremities: Normal range of motion.     Mental Status: Normal mood and affect. Normal behavior. Normal judgment and thought content.   The following were addressed during this visit:  Breastfeeding Education - Early initiation of breastfeeding    Comments: Keeps milk supply adequate, helps contract uterus and slow bleeding, and early milk is the perfect first food and is easy to digest.   - The importance of exclusive breastfeeding    Comments: Provides antibodies, Lower risk of breast and ovarian cancers, and type-2 diabetes,Helps your body recover, Reduced chance of SIDS.   - Risks of giving your baby anything other than breast milk if you are breastfeeding    Comments: Make the baby less content with breastfeeds, may make my baby more susceptible to illness, and may reduce my milk supply.   - The importance of early skin-to-skin contact    Comments: Keeps baby warm and secure, helps keep baby's blood sugar up and breathing steady, easier to bond and breastfeed, and helps calm baby.  - Rooming-in on a 24-hour basis    Comments: Easier to learn baby's feeding cues, easier to bond and get to know each other, and encourages milk production.   - Feeding on demand or baby-led feeding    Comments: Helps prevent breastfeeding complications, helps bring in good milk supply, prevents under or overfeeding, and helps baby feel content and satisfied   - Frequent feeding to help assure optimal milk production    Comments: Making a full supply of milk requires frequent removal  of milk from breasts, infant will eat 8-12 times in 24 hours, if separated from infant use breast massage, hand expression and/ or pumping to remove milk from  breasts.   - Effective positioning and attachment    Comments: Helps my baby to get enough breast milk, helps to produce an adequate milk supply, and helps prevent nipple pain and damage   - Exclusive breastfeeding for the first 6 months    Comments: Builds a healthy milk supply and keeps it up, protects baby from sickness and disease, and breastmilk has everything your baby needs for the first 6 months.    Assessment   29 y.o. G4P0030 at [redacted]w[redacted]d by  06/16/2020, by Last Menstrual Period presenting for routine prenatal visit  Plan   Pregnancy #4 Problems (from 11/04/19 to present)    Problem Noted Resolved   BMI 50.0-59.9, adult (HCC) 11/11/2019 by Conard Novak, MD No   Supervision of high-risk pregnancy 11/04/2019 by [name], MD No   Overview Addendum 03/27/2020  3:13 PM by [name], CNM     Nursing Staff Provider  Office Location  [facility] Dating   LMP=5wk [place]  Language  English Anatomy US    Flu Vaccine  11/04/2019 Genetic Screen  NIPS: Normal XX  TDaP vaccine    Hgb A1C or  GTT Early : 105 Third trimester : 183; 3hr GTT 104/ 209/ 183/ 102  Rhogam   not needed   LAB RESULTS   Feeding Plan  Blood Type B/Positive/-- (03/08 1054)   Contraception  Antibody Negative (03/08 1054)  Circumcision  Rubella <0.90 (03/08 1054) Non-immune  Pediatrician   RPR Non Reactive (03/08 1054)   Support Person  HBsAg Negative (03/08 1054)   Prenatal Classes  HIV Non Reactive (03/08 1054)    Varicella  Immune  BTL Consent  GBS  (For PCN allergy, check sensitivities)        VBAC Consent  Pap  2020 NIL    Hgb Electro   normal    CF      SMA               Previous Version   Obesity in pregnancy, antepartum 11/04/2019 by [name], MD No   Chronic hypertension affecting pregnancy 11/04/2019 by [name], MD No   Overview Addendum 03/26/2020  9:28 AM by [name], CNM    Initial visit:  Discussed initiation of ASA at [redacted] wks gestation   Will  return next wek for BP check, consider initation of antihypertensive if needed     Baseline labs.   11/25/2019 Started procardia XL 30 mg q day 12/23/2019 Increased procardia XL to 60 mg q day  [ ]  Aspirin 81 mg daily after 12 weeks; discontinue after 36 weeks [ ]  baseline labs with CBC, CMP, urine protein/creatinine ratio [ ]  no BP meds unless BPs become elevated [ ]  ultrasound for growth at 28, 32, 36 weeks [ ]  Aspirin 81 mg daily after 12 weeks; discontinue after 36 weeks [ ]  Baseline EKG   Current antihypertensives:  Procardia XL 60 mg  Baseline and surveillance labs (pulled in from EPIC, refresh links as needed)  Lab Results  Component Value Date   PLT 270 03/12/2020   CREATININE 0.86 11/25/2019   AST 17 11/25/2019   ALT 12 11/25/2019    Antenatal Testing CHTN - O10.919  Group I  BP < 140/90, no preeclampsia, AGA,  nml AFV, +/- meds    Group  II BP > 140/90, on meds, no preeclampsia, AGA, nml AFV  20-28-34-38  20-24-28-32-35-38  32//2 x wk  28//BPP wkly then 32//2 x wk  40 no meds; 39 meds  PRN or 37  Pre-eclampsia  GHTN - O13.9/Preeclampsia without severe features  - O14.00   Preeclampsia with severe features - O14.10  Q 3-4wks  Q 2 wks  28//BPP wkly then 32//2 x wk  Inpatient  37  PRN or 34        Previous Version       Preterm labor symptoms and general obstetric precautions including but not limited to vaginal bleeding, contractions, leaking of fluid and fetal movement were reviewed in detail with the patient. Please refer to After Visit Summary for other counseling recommendations.   Return in about 1 week (around 05/06/2020) for nst and rob with MD.  Tresea Mall, CNM 04/29/2020 5:19 PM

## 2020-04-29 NOTE — Progress Notes (Signed)
ROB

## 2020-04-30 ENCOUNTER — Other Ambulatory Visit: Payer: Self-pay | Admitting: Obstetrics and Gynecology

## 2020-04-30 ENCOUNTER — Telehealth: Payer: Self-pay | Admitting: *Deleted

## 2020-04-30 DIAGNOSIS — O24410 Gestational diabetes mellitus in pregnancy, diet controlled: Secondary | ICD-10-CM

## 2020-04-30 DIAGNOSIS — O99210 Obesity complicating pregnancy, unspecified trimester: Secondary | ICD-10-CM

## 2020-04-30 NOTE — Telephone Encounter (Signed)
Phone call to follow up with patient and new insulin administration. She reports no difficulty giving injection. She was using some of her husband's syringes and reports that she is not sure if she is injecting all units. His have a safety feature and she reports there has been an air bubble. She will pick up new syringes today from her pharmacy. No symptoms of hypoglycemia. She reports fasting blood sugars of 94 and 101 after starting insulin. Instructed her to call for any questions.

## 2020-05-07 ENCOUNTER — Ambulatory Visit (INDEPENDENT_AMBULATORY_CARE_PROVIDER_SITE_OTHER): Payer: Medicaid Other | Admitting: Obstetrics and Gynecology

## 2020-05-07 ENCOUNTER — Ambulatory Visit: Payer: Medicaid Other | Attending: Maternal & Fetal Medicine

## 2020-05-07 ENCOUNTER — Other Ambulatory Visit: Payer: Self-pay

## 2020-05-07 ENCOUNTER — Other Ambulatory Visit: Payer: Self-pay | Admitting: Obstetrics and Gynecology

## 2020-05-07 ENCOUNTER — Encounter: Payer: Medicaid Other | Admitting: Advanced Practice Midwife

## 2020-05-07 VITALS — BP 126/74 | Ht 66.0 in | Wt 357.6 lb

## 2020-05-07 DIAGNOSIS — Z6841 Body Mass Index (BMI) 40.0 and over, adult: Secondary | ICD-10-CM

## 2020-05-07 DIAGNOSIS — Z3A34 34 weeks gestation of pregnancy: Secondary | ICD-10-CM | POA: Insufficient documentation

## 2020-05-07 DIAGNOSIS — O0993 Supervision of high risk pregnancy, unspecified, third trimester: Secondary | ICD-10-CM

## 2020-05-07 DIAGNOSIS — Z79899 Other long term (current) drug therapy: Secondary | ICD-10-CM | POA: Insufficient documentation

## 2020-05-07 DIAGNOSIS — O10919 Unspecified pre-existing hypertension complicating pregnancy, unspecified trimester: Secondary | ICD-10-CM

## 2020-05-07 DIAGNOSIS — O139 Gestational [pregnancy-induced] hypertension without significant proteinuria, unspecified trimester: Secondary | ICD-10-CM | POA: Diagnosis not present

## 2020-05-07 DIAGNOSIS — O99210 Obesity complicating pregnancy, unspecified trimester: Secondary | ICD-10-CM

## 2020-05-07 DIAGNOSIS — O99213 Obesity complicating pregnancy, third trimester: Secondary | ICD-10-CM | POA: Diagnosis not present

## 2020-05-07 DIAGNOSIS — O24410 Gestational diabetes mellitus in pregnancy, diet controlled: Secondary | ICD-10-CM

## 2020-05-07 DIAGNOSIS — E669 Obesity, unspecified: Secondary | ICD-10-CM | POA: Diagnosis not present

## 2020-05-07 DIAGNOSIS — O24414 Gestational diabetes mellitus in pregnancy, insulin controlled: Secondary | ICD-10-CM | POA: Diagnosis present

## 2020-05-07 DIAGNOSIS — O133 Gestational [pregnancy-induced] hypertension without significant proteinuria, third trimester: Secondary | ICD-10-CM | POA: Diagnosis not present

## 2020-05-11 ENCOUNTER — Other Ambulatory Visit: Payer: Self-pay

## 2020-05-11 ENCOUNTER — Other Ambulatory Visit: Payer: Self-pay | Admitting: Obstetrics and Gynecology

## 2020-05-11 ENCOUNTER — Ambulatory Visit (INDEPENDENT_AMBULATORY_CARE_PROVIDER_SITE_OTHER): Payer: Medicaid Other | Admitting: Obstetrics and Gynecology

## 2020-05-11 ENCOUNTER — Encounter: Payer: Self-pay | Admitting: Obstetrics and Gynecology

## 2020-05-11 VITALS — BP 130/80 | Ht 66.0 in | Wt 358.8 lb

## 2020-05-11 DIAGNOSIS — Z3A34 34 weeks gestation of pregnancy: Secondary | ICD-10-CM | POA: Diagnosis not present

## 2020-05-11 DIAGNOSIS — O24414 Gestational diabetes mellitus in pregnancy, insulin controlled: Secondary | ICD-10-CM

## 2020-05-11 DIAGNOSIS — O0993 Supervision of high risk pregnancy, unspecified, third trimester: Secondary | ICD-10-CM

## 2020-05-11 DIAGNOSIS — O99213 Obesity complicating pregnancy, third trimester: Secondary | ICD-10-CM

## 2020-05-11 DIAGNOSIS — O99210 Obesity complicating pregnancy, unspecified trimester: Secondary | ICD-10-CM

## 2020-05-11 DIAGNOSIS — O10919 Unspecified pre-existing hypertension complicating pregnancy, unspecified trimester: Secondary | ICD-10-CM

## 2020-05-11 DIAGNOSIS — Z9189 Other specified personal risk factors, not elsewhere classified: Secondary | ICD-10-CM

## 2020-05-11 DIAGNOSIS — O163 Unspecified maternal hypertension, third trimester: Secondary | ICD-10-CM

## 2020-05-11 LAB — POCT URINALYSIS DIPSTICK OB: Glucose, UA: NEGATIVE

## 2020-05-11 NOTE — Patient Instructions (Signed)
Gestational Diabetes Mellitus, Self Care  Caring for yourself after you have been diagnosed with gestational diabetes (gestational diabetes mellitus) means keeping your blood sugar (glucose) under control. You can do that with a balance of:   Nutrition.   Exercise.   Lifestyle changes.   Medicines or insulin, if necessary.   Support from your team of health care providers and others.  The following information explains what you need to know to manage your gestational diabetes at home.  What are the risks?  If gestational diabetes is treated, it is unlikely to cause problems. If it is not controlled with treatment, it may cause problems during labor and delivery, and some of those problems can be harmful to the unborn baby (fetus) and the mother. Uncontrolled gestational diabetes may also cause the newborn baby to have breathing problems and low blood glucose.  Women who get gestational diabetes are more likely to develop it if they get pregnant again, and they are more likely to develop type 2 diabetes in the future.  How to monitor blood glucose     Check your blood glucose every day during your pregnancy. Do this as often as told by your health care provider.   Contact your health care provider if your blood glucose is above your target for two tests in a row.  Your health care provider will set individualized treatment goals for you. Generally, the goal of treatment is to maintain the following blood glucose levels during pregnancy:   Before meals (preprandial): at or below 95 mg/dL (5.3 mmol/L).   After meals (postprandial):  ? One hour after a meal: at or below 140 mg/dL (7.8 mmol/L).  ? Two hours after a meal: at or below 120 mg/dL (6.7 mmol/L).   A1c (hemoglobin A1c) level: 6-6.5%.  How to manage hyperglycemia and hypoglycemia  Hyperglycemia symptoms  Hyperglycemia, also called high blood glucose, occurs when blood glucose is too high. Make sure you know the early signs of hyperglycemia, such  as:   Increased thirst.   Hunger.   Feeling very tired.   Needing to urinate more often than usual.   Blurry vision.  Hypoglycemia symptoms  Hypoglycemia, also called low blood glucose, occurs with a blood glucose level at or below 70 mg/dL (3.9 mmol/L). The risk for hypoglycemia increases during or after exercise, during sleep, during illness, and when skipping meals or not eating for a long time (fasting). Symptoms may include:   Hunger.   Anxiety.   Sweating and feeling clammy.   Confusion.   Dizziness or feeling light-headed.   Sleepiness.   Nausea.   Increased heart rate.   Headache.   Blurry vision.   Irritability.   Tingling or numbness around the mouth, lips, or tongue.   A change in coordination.   Restless sleep.   Fainting.   Seizure.  It is important to know the symptoms of hypoglycemia and treat it right away. Always have a 15-gram rapid-acting carbohydrate snack with you to treat low blood glucose. Family members and close friends should also know the symptoms and should understand how to treat hypoglycemia, in case you are not able to treat yourself.  Treating hypoglycemia  If you are alert and able to swallow safely, follow the 15:15 rule:   Take 15 grams of a rapid-acting carbohydrate. Talk with your health care provider about how much you should take.   Rapid-acting options include:  ? Glucose pills (take 15 grams).  ? 6-8 pieces of hard candy.  ?   mmol/L), take 15 grams of a carbohydrate again.  If your blood glucose level does not increase above 70 mg/dL (3.9 mmol/L) after 3 tries, seek emergency medical care.  After your blood glucose level returns to normal, eat a meal or a snack within 1 hour. Treating  severe hypoglycemia Severe hypoglycemia is when your blood glucose level is at or below 54 mg/dL (3 mmol/L). Severe hypoglycemia is an emergency. Do not wait to see if the symptoms will go away. Get medical help right away. Call your local emergency services (911 in the [place] If you have severe hypoglycemia and you cannot eat or drink, you may need an injection of glucagon. A family member or close friend should learn how to check your blood glucose and how to give you a glucagon injection. Ask your health care provider if you need to have an emergency glucagon injection kit available. Severe hypoglycemia may need to be treated in a hospital. The treatment may include getting glucose through an IV. You may also need treatment for the cause of your hypoglycemia. Follow these instructions at home: Take diabetes medicines as told  If your health care provider prescribed insulin or diabetes medicines, take them every day.  Do not run out of insulin or other diabetes medicines that you take. Plan ahead so you always have these available.  If you use insulin, adjust your dosage based on how physically active you are and what foods you eat. Your health care provider will tell you how to adjust your dosage. Make healthy food choices  The things that you eat and drink affect your blood glucose (and your insulin dose, if this applies). Making good choices helps to control your diabetes and prevent other health problems. A healthy meal plan includes eating lean proteins, complex carbohydrates, fresh fruits and vegetables, low-fat dairy products, and healthy fats. Make an appointment to see a diet and nutrition specialist (registered dietitian) to help you create an eating plan that is right for you. Make sure that you:  Follow instructions from your health care provider about eating or drinking restrictions.  Drink enough fluid to keep your urine pale yellow.  Eat healthy snacks between nutritious  meals.  Keep a record of the carbohydrates that you eat. Do this by reading food labels and learning the standard serving sizes of foods.  Follow your sick day plan whenever you cannot eat or drink as usual. Make this plan in advance with your health care provider.  Stay active  Do 30 or more minutes of physical activity a day, or as much physical activity as your health care provider recommends during your pregnancy. ? Doing 10 minutes of exercise starting 30 minutes after each meal may help to control postprandial blood glucose levels.  If you start a new exercise or activity, work with your health care provider to adjust your insulin, medicines, or food intake as needed. Make healthy lifestyle choices  Do not drink alcohol.  Do not use any tobacco products, such as cigarettes, chewing tobacco, and e-cigarettes. If you need help quitting, ask your health care provider.  Learn to manage stress. If you need help with this, ask your health care provider. Care for your body  Keep your immunizations up to date.  Brush your teeth and gums two times a day, and floss one or more times a day. Visit your dentist one or more times every 6 months.  Maintain a healthy weight during your pregnancy. General instructions  Take over-the-counter and  prescription medicines only as told by your health care provider.  Talk with your health care provider about your risk for high blood pressure during pregnancy (preeclampsia or eclampsia).  Share your diabetes management plan with people in your workplace, school, and household.  Check your urine for ketones during your pregnancy when you are ill and as told by your health care provider.  Carry a medical alert card or wear medical alert jewelry that says you have gestational diabetes.  Keep all follow-up visits during your pregnancy (prenatal) and after delivery (postnatal) as told by your health care provider. This is important. Get the care that  you need after delivery  Have your blood glucose level checked 4-12 weeks after delivery. This is done with an oral glucose tolerance test (OGTT).  Get screened for diabetes at least every 3 years, or as often as told by your health care provider. Questions to ask your health care provider  Do I need to meet with a diabetes educator?  Where can I find a support group for people with gestational diabetes? Where to find more information For more information about gestational diabetes, visit:  American Diabetes Association (ADA): www.diabetes.org  Centers for Disease Control and Prevention (CDC): www.cdc.gov Summary  Check your blood glucose every day during your pregnancy. Do this as often as told by your health care provider.  If your health care provider prescribed insulin or diabetes medicines, take them every day as told.  Keep all follow-up visits during your pregnancy (prenatal) and after delivery (postnatal) as told by your health care provider. This is important.  Have your blood glucose level checked 4-12 weeks after delivery. This information is not intended to replace advice given to you by your health care provider. Make sure you discuss any questions you have with your health care provider. Document Revised: 02/05/2018 Document Reviewed: 09/18/2015 Elsevier Patient Education  2020 Elsevier Inc.  

## 2020-05-11 NOTE — Progress Notes (Signed)
Routine Prenatal Care Visit  Subjective  Kelly Stevens is a 29 y.o. G4P0030 at [redacted]w[redacted]d being seen today for ongoing prenatal care.  She is currently monitored for the following issues for this high-risk pregnancy and has Family history of breast cancer; Supervision of high-risk pregnancy; Obesity in pregnancy, antepartum; Chronic hypertension affecting pregnancy; BMI 50.0-59.9, adult (HCC); and Gestational diabetes mellitus (GDM) in third trimester on their problem list.  ----------------------------------------------------------------------------------- Patient reports no complaints.   Contractions: Irregular. Vag. Bleeding: None.  Movement: Present. Denies leaking of fluid.  ----------------------------------------------------------------------------------- The following portions of the patient's history were reviewed and updated as appropriate: allergies, current medications, past family history, past medical history, past social history, past surgical history and problem list. Problem list updated.   Objective  Blood pressure 130/80, height 5\' 6"  (1.676 m), weight (!) 358 lb 12.8 oz (162.8 kg), last menstrual period 09/10/2019. Pregravid weight 336 lb (152.4 kg) Total Weight Gain 22 lb 12.8 oz (10.3 kg) Urinalysis:      Fetal Status: Fetal Heart Rate (bpm): 150   Movement: Present     General:  Alert, oriented and cooperative. Patient is in no acute distress.  Skin: Skin is warm and dry. No rash noted.   Cardiovascular: Normal heart rate noted  Respiratory: Normal respiratory effort, no problems with respiration noted  Abdomen: Soft, gravid, appropriate for gestational age. Pain/Pressure: Present     Pelvic:  Cervical exam deferred        Extremities: Normal range of motion.     Mental Status: Normal mood and affect. Normal behavior. Normal judgment and thought content.     Assessment   29 y.o. G4P0030 at [redacted]w[redacted]d by  06/16/2020, by Last Menstrual Period presenting for routine  prenatal visit  Plan   Pregnancy #4 Problems (from 11/04/19 to present)    Problem Noted Resolved   BMI 50.0-59.9, adult (HCC) 11/11/2019 by 11/13/2019, MD No   Supervision of high-risk pregnancy 11/04/2019 by 01/04/2020, MD No   Overview Addendum 05/11/2020  3:30 PM by 05/13/2020, MD     Nursing Staff Provider  Office Location  [facility] Dating   LMP=5wk [name]  Language  English Anatomy US   complete  Flu Vaccine  11/04/2019 Genetic Screen  NIPS: Normal XX  TDaP vaccine   04/09/2020 Hgb A1C or  GTT Early : 105 Third trimester : 183; 3hr GTT 104/ 209/ 183/ 102  Rhogam   not needed   LAB RESULTS   Feeding Plan  breast Blood Type B/Positive/-- (03/08 1054)   Contraception  Antibody Negative (03/08 1054)  Circumcision  Rubella <0.90 (03/08 1054) Non-immune  Pediatrician   RPR Non Reactive (03/08 1054)   Support Person  husband HBsAg Negative (03/08 1054)   Prenatal Classes  HIV Non Reactive (03/08 1054)    Varicella  Immune  BTL Consent  GBS  (For PCN allergy, check sensitivities)        VBAC Consent  Pap  2020 NIL    Hgb Electro   normal    CF      SMA               Previous Version   Obesity in pregnancy, antepartum 11/04/2019 by 01/04/2020, MD No   Chronic hypertension affecting pregnancy 11/04/2019 by 01/04/2020, MD No   Overview Addendum 03/26/2020  9:28 AM by 03/28/2020, CNM    Initial visit:  Discussed initiation of ASA at [redacted] wks  gestation   Will return next wek for BP check, consider initation of antihypertensive if needed     Baseline labs.   11/25/2019 Started procardia XL 30 mg q day 12/23/2019 Increased procardia XL to 60 mg q day  [ ]  Aspirin 81 mg daily after 12 weeks; discontinue after 36 weeks [ ]  baseline labs with CBC, CMP, urine protein/creatinine ratio [ ]  no BP meds unless BPs become elevated [ ]  ultrasound for growth at 28, 32, 36 weeks [ ]  Aspirin 81 mg daily after 12 weeks; discontinue after 36 weeks [  ] Baseline EKG   Current antihypertensives:  Procardia XL   Baseline and surveillance labs (pulled in from [facility], refresh links as needed)  Lab Results  Component Value Date   PLT 270 03/12/2020   CREATININE 0.86 11/25/2019   AST 17 11/25/2019   ALT 12 11/25/2019    Antenatal Testing CHTN - O10.919  Group I  BP < 140/90, no preeclampsia, AGA,  nml AFV, +/- meds    Group II BP > 140/90, on meds, no preeclampsia, AGA, nml AFV  20-28-34-38  20-24-28-32-35-38  32//2 x wk  28//BPP wkly then 32//2 x wk  40 no meds; 39 meds  PRN or 37  Pre-eclampsia  GHTN - O13.9/Preeclampsia without severe features  - O14.00   Preeclampsia with severe features - O14.10  Q 3-4wks  Q 2 wks  28//BPP wkly then 32//2 x wk  Inpatient  37  PRN or 34        Previous Version      Patient did not bring log. Reports normal blood glucose at home; ie:  normal fasting and postprandial values. She has not eaten since 7 am, 3:30 blood glucose is 111 today in office. Encouraged to bring log and help 11/27/2019 adjust indulin appropriately.  She is taking 5 units levemir at night.  Reactive NST today.  Will need GBS next visit Will refer for sleep apnea screening  Do you snore loudly? ( louder than talking or loud enough to be heard through closed doors?) NO Do you often feel tired, fatigued, or sleepy during daytime? YES Has anyone observed you stop breathing during sleep? NO Do you have or are you being treated for high blood pressure? YES BMI> 35 YES Age> 50 NO Neck circumference > 40 cm YES Female gender? NO ** if yes to > 3 questions high risk of obstructive sleep apnea ** if yes to <3 questions+ low risk for obstructive sleep apnea   Gestational age appropriate obstetric precautions including but not limited to vaginal bleeding, contractions, leaking of fluid and fetal movement were reviewed in detail with the patient.    Return in about 1 week (around 05/18/2020) for ROB with MD as  planned.  11-26-2004 MD [facility], [facility] 05/11/2020, 3:30 PM

## 2020-05-12 NOTE — Progress Notes (Signed)
Routine Prenatal Care Visit  Subjective  Kelly Stevens is a 29 y.o. G4P0030 at [redacted]w[redacted]d being seen today for ongoing prenatal care.  She is currently monitored for the following issues for this high-risk pregnancy and has Family history of breast cancer; Supervision of high-risk pregnancy; Obesity in pregnancy, antepartum; Chronic hypertension affecting pregnancy; BMI 50.0-59.9, adult (HCC); and Gestational diabetes mellitus (GDM) in third trimester on their problem list.  ----------------------------------------------------------------------------------- Patient reports no complaints.   Contractions: Not present. Vag. Bleeding: None.  Movement: Present. Denies leaking of fluid.  ----------------------------------------------------------------------------------- The following portions of the patient's history were reviewed and updated as appropriate: allergies, current medications, past family history, past medical history, past social history, past surgical history and problem list. Problem list updated.   Objective  Blood pressure 126/74, height 5\' 6"  (1.676 m), weight (!) 357 lb 9.6 oz (162.2 kg), last menstrual period 09/10/2019. Pregravid weight 336 lb (152.4 kg) Total Weight Gain 21 lb 9.6 oz (9.798 kg) Urinalysis:      Fetal Status:     Movement: Present     General:  Alert, oriented and cooperative. Patient is in no acute distress.  Skin: Skin is warm and dry. No rash noted.   Cardiovascular: Normal heart rate noted  Respiratory: Normal respiratory effort, no problems with respiration noted  Abdomen: Soft, gravid, appropriate for gestational age. Pain/Pressure: Present     Pelvic:  Cervical exam deferred        Extremities: Normal range of motion.     Mental Status: Normal mood and affect. Normal behavior. Normal judgment and thought content.     Assessment   29 y.o. G4P0030 at [redacted]w[redacted]d by  06/16/2020, by Last Menstrual Period presenting for routine prenatal visit  Plan    Pregnancy #4 Problems (from 11/04/19 to present)    Problem Noted Resolved   BMI 50.0-59.9, adult (HCC) 11/11/2019 by 11/13/2019, MD No   Supervision of high-risk pregnancy 11/04/2019 by 01/04/2020, MD No   Overview Addendum 05/11/2020  3:30 PM by 05/13/2020, MD     Nursing Staff Provider  Office Location  [facility] Dating   LMP=5wk [name]  Language  English Anatomy US   complete  Flu Vaccine  11/04/2019 Genetic Screen  NIPS: Normal XX  TDaP vaccine   04/09/2020 Hgb A1C or  GTT Early : 105 Third trimester : 183; 3hr GTT 104/ 209/ 183/ 102  Rhogam   not needed   LAB RESULTS   Feeding Plan  breast Blood Type B/Positive/-- (03/08 1054)   Contraception  Antibody Negative (03/08 1054)  Circumcision  Rubella <0.90 (03/08 1054) Non-immune  Pediatrician   RPR Non Reactive (03/08 1054)   Support Person  husband HBsAg Negative (03/08 1054)   Prenatal Classes  HIV Non Reactive (03/08 1054)    Varicella  Immune  BTL Consent  GBS  (For PCN allergy, check sensitivities)        VBAC Consent  Pap  2020 NIL    Hgb Electro   normal    CF      SMA               Previous Version   Obesity in pregnancy, antepartum 11/04/2019 by 01/04/2020, MD No   Chronic hypertension affecting pregnancy 11/04/2019 by 01/04/2020, MD No   Overview Addendum 03/26/2020  9:28 AM by 03/28/2020, CNM    Initial visit:  Discussed initiation of ASA at [redacted] wks gestation  Will return next wek for BP check, consider initation of antihypertensive if needed     Baseline labs.   11/25/2019 Started procardia XL 30 mg q day 12/23/2019 Increased procardia XL to 60 mg q day  [ ]  Aspirin 81 mg daily after 12 weeks; discontinue after 36 weeks [ ]  baseline labs with CBC, CMP, urine protein/creatinine ratio [ ]  no BP meds unless BPs become elevated [ ]  ultrasound for growth at 28, 32, 36 weeks [ ]  Aspirin 81 mg daily after 12 weeks; discontinue after 36 weeks [ ]  Baseline EKG    Current antihypertensives:  Procardia XL   Baseline and surveillance labs (pulled in from [facility], refresh links as needed)  Lab Results  Component Value Date   PLT 270 03/12/2020   CREATININE 0.86 11/25/2019   AST 17 11/25/2019   ALT 12 11/25/2019    Antenatal Testing CHTN - O10.919  Group I  BP < 140/90, no preeclampsia, AGA,  nml AFV, +/- meds    Group II BP > 140/90, on meds, no preeclampsia, AGA, nml AFV  20-28-34-38  20-24-28-32-35-38  32//2 x wk  28//BPP wkly then 32//2 x wk  40 no meds; 39 meds  PRN or 37  Pre-eclampsia  GHTN - O13.9/Preeclampsia without severe features  - O14.00   Preeclampsia with severe features - O14.10  Q 3-4wks  Q 2 wks  28//BPP wkly then 32//2 x wk  Inpatient  37  PRN or 34        Previous Version       Patient has been having difficulty getting the right needles for the insulin. She has just obtained the right needles and feels like this will help with administering the right amount of insulin.  Log reviewed and less than 50% values elevated.  Discussed warning signs of preeclampsia.  NST reactive.   Gestational age appropriate obstetric precautions including but not limited to vaginal bleeding, contractions, leaking of fluid and fetal movement were reviewed in detail with the patient.    Twice a week moving forward with MD for NST  11/27/2019 MD [facility], [facility] 05/12/2020, 8:20 AM

## 2020-05-14 ENCOUNTER — Ambulatory Visit: Payer: Medicaid Other | Attending: Obstetrics

## 2020-05-14 ENCOUNTER — Other Ambulatory Visit: Payer: Self-pay

## 2020-05-14 DIAGNOSIS — O139 Gestational [pregnancy-induced] hypertension without significant proteinuria, unspecified trimester: Secondary | ICD-10-CM

## 2020-05-14 DIAGNOSIS — O99210 Obesity complicating pregnancy, unspecified trimester: Secondary | ICD-10-CM

## 2020-05-14 DIAGNOSIS — O24414 Gestational diabetes mellitus in pregnancy, insulin controlled: Secondary | ICD-10-CM | POA: Diagnosis present

## 2020-05-14 DIAGNOSIS — O163 Unspecified maternal hypertension, third trimester: Secondary | ICD-10-CM | POA: Diagnosis not present

## 2020-05-14 DIAGNOSIS — O99213 Obesity complicating pregnancy, third trimester: Secondary | ICD-10-CM

## 2020-05-14 DIAGNOSIS — Z3A35 35 weeks gestation of pregnancy: Secondary | ICD-10-CM | POA: Diagnosis not present

## 2020-05-14 DIAGNOSIS — O133 Gestational [pregnancy-induced] hypertension without significant proteinuria, third trimester: Secondary | ICD-10-CM | POA: Insufficient documentation

## 2020-05-14 DIAGNOSIS — Z794 Long term (current) use of insulin: Secondary | ICD-10-CM

## 2020-05-14 DIAGNOSIS — O0993 Supervision of high risk pregnancy, unspecified, third trimester: Secondary | ICD-10-CM

## 2020-05-14 DIAGNOSIS — Z6841 Body Mass Index (BMI) 40.0 and over, adult: Secondary | ICD-10-CM

## 2020-05-14 DIAGNOSIS — O10919 Unspecified pre-existing hypertension complicating pregnancy, unspecified trimester: Secondary | ICD-10-CM

## 2020-05-15 ENCOUNTER — Encounter
Admission: RE | Admit: 2020-05-15 | Discharge: 2020-05-15 | Disposition: A | Discharge status: Home / Self Care | Payer: Medicaid Other | Source: Ambulatory Visit | Attending: Anesthesiology | Admitting: Anesthesiology

## 2020-05-15 NOTE — Consult Note (Signed)
[facility] Anesthesia Consultation  Kelly Stevens ZOX:[id number] DOB: 03-01-1991 DOA: 05/15/2020 PCP: Homero Fellers, MD   Requesting physician: Dr. Gilman Schmidt Date of consultation: 05/15/20 Reason for consultation: Obesity during pregnancy  CHIEF COMPLAINT:  Obesity during pregnancy  HISTORY OF PRESENT ILLNESS: Kelly Stevens  is a 29 y.o. Stevens with a known history of obesity during pregnancy. Currently on procardia for cHTN with good BP control. Has been diagnosed with gDM and using low dose levemir with good glucose control.   PAST MEDICAL HISTORY:   Past Medical History:  Diagnosis Date  . BMI 50.0-59.9, adult ([facility])   . Gestational diabetes   . Habitual aborter     PAST SURGICAL HISTORY: No past surgical history on file.  SOCIAL HISTORY:  Social History   Tobacco Use  . Smoking status: Former Smoker    Packs/day: 0.10    Years: 4.00    Pack years: 0.40    Types: Cigarettes    Quit date: 09/25/2017    Years since quitting: 2.6  . Smokeless tobacco: Former Systems developer  . Tobacco comment: not consistent  Substance Use Topics  . Alcohol use: No    FAMILY HISTORY:  Family History  Problem Relation Age of Onset  . Breast cancer Maternal Aunt 68  . Uterine cancer Maternal Aunt 34  . Diabetes Mother   . Hypertension Mother   . Diabetes Father   . Hypertension Father   . Diabetes Maternal Grandmother   . Hypertension Maternal Grandmother   . Hyperlipidemia Maternal Grandmother   . Heart disease Maternal Grandmother   . Diabetes Maternal Aunt     DRUG ALLERGIES:  Allergies  Allergen Reactions  . Nickel Rash    REVIEW OF SYSTEMS:   RESPIRATORY: No cough, shortness of breath, wheezing.  CARDIOVASCULAR: No chest pain, orthopnea, edema.  HEMATOLOGY: No anemia, easy bruising or bleeding SKIN: No rash or lesion. NEUROLOGIC: No tingling, numbness, weakness.  PSYCHIATRY: No anxiety or depression.   MEDICATIONS AT HOME:   Prior to Admission medications   Medication Sig Start Date End Date Taking? Authorizing Provider  Accu-Chek Softclix Lancets lancets Use as instructed 04/02/20   Rod Can, CNM  aspirin 81 MG chewable tablet Chew 81 mg by mouth daily.    [provider]  Blood Pressure KIT 1 each by Does not apply route in the morning and at bedtime. 12/23/19   Adrian Prows R, MD  glucose blood (ACCU-CHEK GUIDE) test strip Use as instructed 04/02/20   Rod Can, CNM  insulin detemir (LEVEMIR) 100 UNIT/ML injection Inject 0.05 mLs (5 Units total) into the skin at bedtime. 04/24/20   Will Bonnet, MD  NIFEdipine (PROCARDIA XL) 60 MG 24 hr tablet Take 1 tablet (60 mg total) by mouth daily. 12/23/19   Schuman, Stefanie Libel, MD  Prenatal Vit-Fe Fumarate-FA (PRENATAL MULTIVITAMIN) TABS tablet Take 1 tablet by mouth daily at 12 noon.    [provider]  promethazine (PHENERGAN) 25 MG tablet Take 1 tablet (25 mg total) by mouth every 6 (six) hours as needed for nausea or vomiting. 11/25/19   Homero Fellers, MD  SYRINGE/NEEDLE, DISP, 1 ML 25G X 5/8" 1 ML MISC Use 1 syringe per night for insulin injection 04/29/20   Rod Can, CNM      PHYSICAL EXAMINATION:   VITAL SIGNS: Last menstrual period 09/10/2019.  GENERAL:  29 y.o.-year-old patient no acute distress.  HEENT: Head atraumatic, normocephalic. Oropharynx and nasopharynx clear. MP 2, TM distance >3 cm,  normal mouth opening, grade 1 upper lip bite LUNGS: No use of accessory muscles of respiration.   EXTREMITIES: No pedal edema, cyanosis, or clubbing.  NEUROLOGIC: normal gait PSYCHIATRIC: The patient is alert and oriented x 3.  SKIN: No obvious rash, lesion, or ulcer.    IMPRESSION AND PLAN:   Kelly Stevens  is a 29 y.o. Stevens presenting with obesity during pregnancy. BMI is currently 57 at [redacted] weeks gestation.   Airway exam remains reassuring today. Spinal interspaces palpable.   We discussed analgesic  options during labor including epidural analgesia. Discussed that in obesity there can be increased difficulty with epidural placement or even failure of successful epidural. We also discussed that even after successful epidural placement there is increased risk of catheter migration out of the epidural space that would require catheter replacement. Discussed use of epidural vs spinal vs GA if cesarean delivery is required. Discussed increased risk of difficult intubation during pregnancy should an emergency cesarean delivery be required.   Plan for delivery at [facility].

## 2020-05-18 ENCOUNTER — Ambulatory Visit (INDEPENDENT_AMBULATORY_CARE_PROVIDER_SITE_OTHER): Payer: Medicaid Other | Admitting: Obstetrics and Gynecology

## 2020-05-18 ENCOUNTER — Other Ambulatory Visit: Payer: Medicaid Other

## 2020-05-18 ENCOUNTER — Other Ambulatory Visit: Payer: Self-pay

## 2020-05-18 ENCOUNTER — Other Ambulatory Visit: Payer: Self-pay | Admitting: Obstetrics and Gynecology

## 2020-05-18 VITALS — BP 134/88 | Wt 360.0 lb

## 2020-05-18 DIAGNOSIS — O24414 Gestational diabetes mellitus in pregnancy, insulin controlled: Secondary | ICD-10-CM

## 2020-05-18 DIAGNOSIS — Z3685 Encounter for antenatal screening for Streptococcus B: Secondary | ICD-10-CM

## 2020-05-18 DIAGNOSIS — O0993 Supervision of high risk pregnancy, unspecified, third trimester: Secondary | ICD-10-CM | POA: Diagnosis not present

## 2020-05-18 DIAGNOSIS — O133 Gestational [pregnancy-induced] hypertension without significant proteinuria, third trimester: Secondary | ICD-10-CM

## 2020-05-18 DIAGNOSIS — O99213 Obesity complicating pregnancy, third trimester: Secondary | ICD-10-CM

## 2020-05-18 DIAGNOSIS — O99210 Obesity complicating pregnancy, unspecified trimester: Secondary | ICD-10-CM

## 2020-05-18 DIAGNOSIS — O10919 Unspecified pre-existing hypertension complicating pregnancy, unspecified trimester: Secondary | ICD-10-CM

## 2020-05-18 LAB — FETAL NONSTRESS TEST

## 2020-05-18 NOTE — Progress Notes (Signed)
ROB NST 

## 2020-05-18 NOTE — Progress Notes (Signed)
Routine Prenatal Care Visit  Subjective  Kelly Stevens is a 29 y.o. G4P0030 at [redacted]w[redacted]d being seen today for ongoing prenatal care.  She is currently monitored for the following issues for this high-risk pregnancy and has Family history of breast cancer; Supervision of high-risk pregnancy; Obesity in pregnancy, antepartum; Chronic hypertension affecting pregnancy; BMI 50.0-59.9, adult (HCC); and Gestational diabetes mellitus (GDM) in third trimester on their problem list.  ----------------------------------------------------------------------------------- Patient reports no complaints.   Contractions: Irregular.  .  Movement: Present. Denies leaking of fluid.  ----------------------------------------------------------------------------------- The following portions of the patient's history were reviewed and updated as appropriate: allergies, current medications, past family history, past medical history, past social history, past surgical history and problem list. Problem list updated.   Objective  Blood pressure 134/88, weight (!) 360 lb (163.3 kg), last menstrual period 09/10/2019. Pregravid weight 336 lb (152.4 kg) Total Weight Gain 24 lb (10.9 kg) Urinalysis:      Fetal Status: Fetal Heart Rate (bpm): 140   Movement: Present  Presentation: Vertex  General:  Alert, oriented and cooperative. Patient is in no acute distress.  Skin: Skin is warm and dry. No rash noted.   Cardiovascular: Normal heart rate noted  Respiratory: Normal respiratory effort, no problems with respiration noted  Abdomen: Soft, gravid, appropriate for gestational age. Pain/Pressure: Present     Pelvic:  Cervical exam deferred        Extremities: Normal range of motion.     ental Status: Normal mood and affect. Normal behavior. Normal judgment and thought content.   Baseline: 140 Variability: moderate Accelerations: present Decelerations: absent Tocometry: N/A The patient was monitored for 30 minutes, fetal  heart rate tracing was deemed reactive, category I tracing,  Date Fasting Breakfast Lunch Dinner  9/15 93 107 102 127  9/16 94 115 120 122  9/17 100 123 118 109  9/18 99 97 103 122  9/19 101 128 109 112  9/20 97 113       Assessment   29 y.o. G4P0030 at [redacted]w[redacted]d by  06/16/2020, by Last Menstrual Period presenting for routine prenatal visit  Plan   Pregnancy #4 Problems (from 11/04/19 to present)    Problem Noted Resolved   BMI 50.0-59.9, adult (HCC) 11/11/2019 by Conard Novak, MD No   Supervision of high-risk pregnancy 11/04/2019 by [name], MD No   Overview Addendum 05/11/2020  3:30 PM by [name], MD     Nursing Staff Provider  Office Location  [facility] Dating   LMP=5wk [place]  Language  English Anatomy US   complete  Flu Vaccine  11/04/2019 Genetic Screen  NIPS: Normal XX  TDaP vaccine   04/09/2020 Hgb A1C or  GTT Early : 105 Third trimester : 183; 3hr GTT 104/ 209/ 183/ 102  Rhogam   not needed   LAB RESULTS   Feeding Plan  breast Blood Type B/Positive/-- (03/08 1054)   Contraception  Antibody Negative (03/08 1054)  Circumcision  Rubella <0.90 (03/08 1054) Non-immune  Pediatrician   RPR Non Reactive (03/08 1054)   Support Person  husband HBsAg Negative (03/08 1054)   Prenatal Classes  HIV Non Reactive (03/08 1054)    Varicella  Immune  BTL Consent  GBS  (For PCN allergy, check sensitivities)        VBAC Consent  Pap  2020 NIL    Hgb Electro   normal    CF      SMA  Previous Version   Obesity in pregnancy, antepartum 11/04/2019 by [name], MD No   Chronic hypertension affecting pregnancy 11/04/2019 by [name], MD No   Overview Addendum 03/26/2020  9:28 AM by [name], CNM    Initial visit:  Discussed initiation of ASA at [redacted] wks gestation   Will return next wek for BP check, consider initation of antihypertensive if needed     Baseline labs.   11/25/2019 Started procardia XL 30 mg q  day 12/23/2019 Increased procardia XL to 60 mg q day  [ ]  Aspirin 81 mg daily after 12 weeks; discontinue after 36 weeks [ ]  baseline labs with CBC, CMP, urine protein/creatinine ratio [ ]  no BP meds unless BPs become elevated [ ]  ultrasound for growth at 28, 32, 36 weeks [ ]  Aspirin 81 mg daily after 12 weeks; discontinue after 36 weeks [ ]  Baseline EKG   Current antihypertensives:  Procardia XL   Baseline and surveillance labs (pulled in from [facility], refresh links as needed)  Lab Results  Component Value Date   PLT 270 03/12/2020   CREATININE 0.86 11/25/2019   AST 17 11/25/2019   ALT 12 11/25/2019    Antenatal Testing CHTN - O10.919  Group I  BP < 140/90, no preeclampsia, AGA,  nml AFV, +/- meds    Group II BP > 140/90, on meds, no preeclampsia, AGA, nml AFV  20-28-34-38  20-24-28-32-35-38  32//2 x wk  28//BPP wkly then 32//2 x wk  40 no meds; 39 meds  PRN or 37  Pre-eclampsia  GHTN - O13.9/Preeclampsia without severe features  - O14.00   Preeclampsia with severe features - O14.10  Q 3-4wks  Q 2 wks  28//BPP wkly then 32//2 x wk  Inpatient  37  PRN or 34        Previous Version       Gestational age appropriate obstetric precautions including but not limited to vaginal bleeding, contractions, leaking of fluid and fetal movement were reviewed in detail with the patient.    Discussed GBS want to wait till next visit  BG well controlled no medication changes  Return in about 3 days (around 05/21/2020) for NST, ROB, GROWTH SCAN.  11/27/2019, MD, 11/27/2019 OB/GYN, [facility] 05/18/2020, 4:48 PM

## 2020-05-21 ENCOUNTER — Other Ambulatory Visit: Payer: Self-pay

## 2020-05-21 ENCOUNTER — Ambulatory Visit: Payer: Medicaid Other | Attending: Maternal & Fetal Medicine

## 2020-05-21 DIAGNOSIS — O99213 Obesity complicating pregnancy, third trimester: Secondary | ICD-10-CM | POA: Diagnosis not present

## 2020-05-21 DIAGNOSIS — O139 Gestational [pregnancy-induced] hypertension without significant proteinuria, unspecified trimester: Secondary | ICD-10-CM

## 2020-05-21 DIAGNOSIS — Z794 Long term (current) use of insulin: Secondary | ICD-10-CM | POA: Diagnosis not present

## 2020-05-21 DIAGNOSIS — Z6841 Body Mass Index (BMI) 40.0 and over, adult: Secondary | ICD-10-CM

## 2020-05-21 DIAGNOSIS — O24414 Gestational diabetes mellitus in pregnancy, insulin controlled: Secondary | ICD-10-CM | POA: Diagnosis present

## 2020-05-21 DIAGNOSIS — Z3A36 36 weeks gestation of pregnancy: Secondary | ICD-10-CM | POA: Insufficient documentation

## 2020-05-21 DIAGNOSIS — E669 Obesity, unspecified: Secondary | ICD-10-CM | POA: Diagnosis not present

## 2020-05-21 DIAGNOSIS — O99210 Obesity complicating pregnancy, unspecified trimester: Secondary | ICD-10-CM

## 2020-05-21 DIAGNOSIS — O133 Gestational [pregnancy-induced] hypertension without significant proteinuria, third trimester: Secondary | ICD-10-CM

## 2020-05-21 DIAGNOSIS — O10919 Unspecified pre-existing hypertension complicating pregnancy, unspecified trimester: Secondary | ICD-10-CM

## 2020-05-21 DIAGNOSIS — O0993 Supervision of high risk pregnancy, unspecified, third trimester: Secondary | ICD-10-CM

## 2020-05-25 ENCOUNTER — Ambulatory Visit (INDEPENDENT_AMBULATORY_CARE_PROVIDER_SITE_OTHER): Payer: Medicaid Other | Admitting: Obstetrics and Gynecology

## 2020-05-25 ENCOUNTER — Other Ambulatory Visit (HOSPITAL_COMMUNITY)
Admission: RE | Admit: 2020-05-25 | Discharge: 2020-05-25 | Disposition: A | Discharge status: Home / Self Care | Payer: Medicaid Other | Source: Ambulatory Visit | Attending: Obstetrics and Gynecology | Admitting: Obstetrics and Gynecology

## 2020-05-25 ENCOUNTER — Other Ambulatory Visit: Payer: Self-pay | Admitting: Obstetrics and Gynecology

## 2020-05-25 ENCOUNTER — Other Ambulatory Visit: Payer: Self-pay

## 2020-05-25 ENCOUNTER — Encounter: Payer: Self-pay | Admitting: Obstetrics and Gynecology

## 2020-05-25 VITALS — BP 136/88 | Wt 365.0 lb

## 2020-05-25 DIAGNOSIS — O99213 Obesity complicating pregnancy, third trimester: Secondary | ICD-10-CM

## 2020-05-25 DIAGNOSIS — O24414 Gestational diabetes mellitus in pregnancy, insulin controlled: Secondary | ICD-10-CM

## 2020-05-25 DIAGNOSIS — O0993 Supervision of high risk pregnancy, unspecified, third trimester: Secondary | ICD-10-CM

## 2020-05-25 DIAGNOSIS — Z3A36 36 weeks gestation of pregnancy: Secondary | ICD-10-CM | POA: Insufficient documentation

## 2020-05-25 DIAGNOSIS — O10919 Unspecified pre-existing hypertension complicating pregnancy, unspecified trimester: Secondary | ICD-10-CM | POA: Diagnosis not present

## 2020-05-25 DIAGNOSIS — O99210 Obesity complicating pregnancy, unspecified trimester: Secondary | ICD-10-CM

## 2020-05-25 NOTE — Progress Notes (Signed)
Routine Prenatal Care Visit  Subjective  Kelly Stevens is a 29 y.o. G4P0030 at [redacted]w[redacted]d being seen today for ongoing prenatal care.  She is currently monitored for the following issues for this high-risk pregnancy and has Family history of breast cancer; Supervision of high-risk pregnancy; Obesity in pregnancy, antepartum; Chronic hypertension affecting pregnancy; BMI 50.0-59.9, adult (HCC); and Gestational diabetes mellitus (GDM) in third trimester on their problem list.  ----------------------------------------------------------------------------------- Patient reports discomfort.   Contractions: Irregular. Vag. Bleeding: None.  Movement: Present. Leaking Fluid denies.  ----------------------------------------------------------------------------------- The following portions of the patient's history were reviewed and updated as appropriate: allergies, current medications, past family history, past medical history, past social history, past surgical history and problem list. Problem list updated.  Objective  Blood pressure 136/88, weight (!) 365 lb (165.6 kg), last menstrual period 09/10/2019. Pregravid weight 336 lb (152.4 kg) Total Weight Gain 29 lb (13.2 kg) Urinalysis: Urine Protein    Urine Glucose    Fetal Status: Fetal Heart Rate (bpm): 145   Movement: Present     General:  Alert, oriented and cooperative. Patient is in no acute distress.  Skin: Skin is warm and dry. No rash noted.   Cardiovascular: Normal heart rate noted  Respiratory: Normal respiratory effort, no problems with respiration noted  Abdomen: Soft, gravid, appropriate for gestational age. Pain/Pressure: Present     Pelvic:  Cervical exam deferred        Extremities: Normal range of motion.     Mental Status: Normal mood and affect. Normal behavior. Normal judgment and thought content.   NST: Baseline FHR: 140 beats/min Variability: moderate Accelerations: present Decelerations: absent Tocometry: not  done  Interpretation:  INDICATIONS: gestational diabetes mellitus and chronic hypertension RESULTS:  A NST procedure was performed with FHR monitoring and a normal baseline established, appropriate time of 20-40 minutes of evaluation, and accels >2 seen w 15x15 characteristics.  Results show a REACTIVE NST.    Assessment   29 y.o. G4P0030 at [redacted]w[redacted]d by  06/16/2020, by Last Menstrual Period presenting for routine prenatal visit  Plan   Pregnancy #4 Problems (from 11/04/19 to present)    Problem Noted Resolved   BMI 50.0-59.9, adult (HCC) 11/11/2019 by Conard Novak, MD No   Supervision of high-risk pregnancy 11/04/2019 by [name], MD No   Overview Addendum 05/11/2020  3:30 PM by [name], MD     Nursing Staff Provider  Office Location  [facility] Dating   LMP=5wk [place]  Language  English Anatomy US   complete  Flu Vaccine  11/04/2019 Genetic Screen  NIPS: Normal XX  TDaP vaccine   04/09/2020 Hgb A1C or  GTT Early : 105 Third trimester : 183; 3hr GTT 104/ 209/ 183/ 102  Rhogam   not needed   LAB RESULTS   Feeding Plan  breast Blood Type B/Positive/-- (03/08 1054)   Contraception  Antibody Negative (03/08 1054)  Circumcision  Rubella <0.90 (03/08 1054) Non-immune  Pediatrician   RPR Non Reactive (03/08 1054)   Support Person  husband HBsAg Negative (03/08 1054)   Prenatal Classes  HIV Non Reactive (03/08 1054)    Varicella  Immune  BTL Consent  GBS  (For PCN allergy, check sensitivities)        VBAC Consent  Pap  2020 NIL    Hgb Electro   normal    CF      SMA               Previous Version  Obesity in pregnancy, antepartum 11/04/2019 by [name], MD No   Chronic hypertension affecting pregnancy 11/04/2019 by [name], MD No   Overview Addendum 03/26/2020  9:28 AM by [name], CNM    Initial visit:  Discussed initiation of ASA at [redacted] wks gestation   Will return next wek for BP check, consider initation of antihypertensive  if needed     Baseline labs.   11/25/2019 Started procardia XL 30 mg q day 12/23/2019 Increased procardia XL to 60 mg q day  [ ]  Aspirin 81 mg daily after 12 weeks; discontinue after 36 weeks [ ]  baseline labs with CBC, CMP, urine protein/creatinine ratio [ ]  no BP meds unless BPs become elevated [ ]  ultrasound for growth at 28, 32, 36 weeks [ ]  Aspirin 81 mg daily after 12 weeks; discontinue after 36 weeks [ ]  Baseline EKG   Current antihypertensives:  Procardia XL   Baseline and surveillance labs (pulled in from [facility], refresh links as needed)  Lab Results  Component Value Date   PLT 270 03/12/2020   CREATININE 0.86 11/25/2019   AST 17 11/25/2019   ALT 12 11/25/2019    Antenatal Testing CHTN - O10.919  Group I  BP < 140/90, no preeclampsia, AGA,  nml AFV, +/- meds    Group II BP > 140/90, on meds, no preeclampsia, AGA, nml AFV  20-28-34-38  20-24-28-32-35-38  32//2 x wk  28//BPP wkly then 32//2 x wk  40 no meds; 39 meds  PRN or 37  Pre-eclampsia  GHTN - O13.9/Preeclampsia without severe features  - O14.00   Preeclampsia with severe features - O14.10  Q 3-4wks  Q 2 wks  28//BPP wkly then 32//2 x wk  Inpatient  37  PRN or 34        Previous Version       Preterm labor symptoms and general obstetric precautions including but not limited to vaginal bleeding, contractions, leaking of fluid and fetal movement were reviewed in detail with the patient. Please refer to After Visit Summary for other counseling recommendations.   Increase long acting insulin to 16 units at bed time. GBS/Aptima today  Return for Keep previously scheduled appt.  11/27/2019, MD, 11/27/2019 OB/GYN, [facility] 05/25/2020 4:04 PM

## 2020-05-27 LAB — CERVICOVAGINAL ANCILLARY ONLY
Chlamydia: NEGATIVE
Comment: NEGATIVE
Comment: NORMAL
Neisseria Gonorrhea: NEGATIVE

## 2020-05-28 ENCOUNTER — Other Ambulatory Visit: Payer: Self-pay | Admitting: Obstetrics and Gynecology

## 2020-05-28 ENCOUNTER — Ambulatory Visit: Payer: Medicaid Other | Attending: Maternal & Fetal Medicine

## 2020-05-28 ENCOUNTER — Other Ambulatory Visit: Payer: Self-pay

## 2020-05-28 DIAGNOSIS — O99213 Obesity complicating pregnancy, third trimester: Secondary | ICD-10-CM | POA: Diagnosis not present

## 2020-05-28 DIAGNOSIS — Z3A37 37 weeks gestation of pregnancy: Secondary | ICD-10-CM

## 2020-05-28 DIAGNOSIS — O133 Gestational [pregnancy-induced] hypertension without significant proteinuria, third trimester: Secondary | ICD-10-CM | POA: Diagnosis not present

## 2020-05-28 DIAGNOSIS — O10919 Unspecified pre-existing hypertension complicating pregnancy, unspecified trimester: Secondary | ICD-10-CM

## 2020-05-28 DIAGNOSIS — Z6841 Body Mass Index (BMI) 40.0 and over, adult: Secondary | ICD-10-CM

## 2020-05-28 DIAGNOSIS — O1213 Gestational proteinuria, third trimester: Secondary | ICD-10-CM | POA: Insufficient documentation

## 2020-05-28 DIAGNOSIS — O24414 Gestational diabetes mellitus in pregnancy, insulin controlled: Secondary | ICD-10-CM | POA: Insufficient documentation

## 2020-05-28 DIAGNOSIS — O0993 Supervision of high risk pregnancy, unspecified, third trimester: Secondary | ICD-10-CM

## 2020-05-28 DIAGNOSIS — O99210 Obesity complicating pregnancy, unspecified trimester: Secondary | ICD-10-CM

## 2020-05-28 LAB — STREP GP B NAA: Strep Gp B NAA: NEGATIVE

## 2020-06-01 ENCOUNTER — Other Ambulatory Visit: Payer: Self-pay | Admitting: Obstetrics and Gynecology

## 2020-06-01 ENCOUNTER — Ambulatory Visit (INDEPENDENT_AMBULATORY_CARE_PROVIDER_SITE_OTHER): Payer: Medicaid Other | Admitting: Obstetrics and Gynecology

## 2020-06-01 ENCOUNTER — Encounter: Payer: Self-pay | Admitting: Obstetrics and Gynecology

## 2020-06-01 ENCOUNTER — Inpatient Hospital Stay
Admission: EM | Admit: 2020-06-01 | Discharge: 2020-06-06 | DRG: 786 | Disposition: A | Discharge status: Home / Self Care | Payer: Medicaid Other | Attending: Obstetrics & Gynecology | Admitting: Obstetrics & Gynecology

## 2020-06-01 ENCOUNTER — Other Ambulatory Visit: Payer: Self-pay

## 2020-06-01 VITALS — BP 120/70 | Ht 66.0 in | Wt 374.2 lb

## 2020-06-01 DIAGNOSIS — Z3A37 37 weeks gestation of pregnancy: Secondary | ICD-10-CM

## 2020-06-01 DIAGNOSIS — O24414 Gestational diabetes mellitus in pregnancy, insulin controlled: Secondary | ICD-10-CM

## 2020-06-01 DIAGNOSIS — Z23 Encounter for immunization: Secondary | ICD-10-CM

## 2020-06-01 DIAGNOSIS — O99210 Obesity complicating pregnancy, unspecified trimester: Secondary | ICD-10-CM

## 2020-06-01 DIAGNOSIS — O10919 Unspecified pre-existing hypertension complicating pregnancy, unspecified trimester: Secondary | ICD-10-CM | POA: Diagnosis present

## 2020-06-01 DIAGNOSIS — O26893 Other specified pregnancy related conditions, third trimester: Secondary | ICD-10-CM | POA: Diagnosis present

## 2020-06-01 DIAGNOSIS — E669 Obesity, unspecified: Secondary | ICD-10-CM | POA: Diagnosis not present

## 2020-06-01 DIAGNOSIS — O24419 Gestational diabetes mellitus in pregnancy, unspecified control: Secondary | ICD-10-CM

## 2020-06-01 DIAGNOSIS — O9081 Anemia of the puerperium: Secondary | ICD-10-CM | POA: Diagnosis not present

## 2020-06-01 DIAGNOSIS — O163 Unspecified maternal hypertension, third trimester: Secondary | ICD-10-CM | POA: Diagnosis not present

## 2020-06-01 DIAGNOSIS — O0993 Supervision of high risk pregnancy, unspecified, third trimester: Secondary | ICD-10-CM

## 2020-06-01 DIAGNOSIS — O24424 Gestational diabetes mellitus in childbirth, insulin controlled: Secondary | ICD-10-CM | POA: Diagnosis present

## 2020-06-01 DIAGNOSIS — O133 Gestational [pregnancy-induced] hypertension without significant proteinuria, third trimester: Secondary | ICD-10-CM

## 2020-06-01 DIAGNOSIS — O99892 Other specified diseases and conditions complicating childbirth: Secondary | ICD-10-CM | POA: Diagnosis not present

## 2020-06-01 DIAGNOSIS — O99214 Obesity complicating childbirth: Secondary | ICD-10-CM | POA: Diagnosis present

## 2020-06-01 DIAGNOSIS — Z20822 Contact with and (suspected) exposure to covid-19: Secondary | ICD-10-CM | POA: Diagnosis present

## 2020-06-01 DIAGNOSIS — N179 Acute kidney failure, unspecified: Secondary | ICD-10-CM | POA: Diagnosis not present

## 2020-06-01 DIAGNOSIS — O99213 Obesity complicating pregnancy, third trimester: Secondary | ICD-10-CM

## 2020-06-01 DIAGNOSIS — D62 Acute posthemorrhagic anemia: Secondary | ICD-10-CM | POA: Diagnosis not present

## 2020-06-01 DIAGNOSIS — O1092 Unspecified pre-existing hypertension complicating childbirth: Secondary | ICD-10-CM | POA: Diagnosis not present

## 2020-06-01 DIAGNOSIS — O1002 Pre-existing essential hypertension complicating childbirth: Secondary | ICD-10-CM | POA: Diagnosis present

## 2020-06-01 DIAGNOSIS — O339 Maternal care for disproportion, unspecified: Secondary | ICD-10-CM | POA: Diagnosis present

## 2020-06-01 DIAGNOSIS — O4292 Full-term premature rupture of membranes, unspecified as to length of time between rupture and onset of labor: Secondary | ICD-10-CM | POA: Diagnosis present

## 2020-06-01 DIAGNOSIS — O411230 Chorioamnionitis, third trimester, not applicable or unspecified: Secondary | ICD-10-CM | POA: Diagnosis present

## 2020-06-01 DIAGNOSIS — Z6841 Body Mass Index (BMI) 40.0 and over, adult: Secondary | ICD-10-CM

## 2020-06-01 DIAGNOSIS — Z87891 Personal history of nicotine dependence: Secondary | ICD-10-CM | POA: Diagnosis not present

## 2020-06-01 DIAGNOSIS — Z833 Family history of diabetes mellitus: Secondary | ICD-10-CM

## 2020-06-01 DIAGNOSIS — O1495 Unspecified pre-eclampsia, complicating the puerperium: Secondary | ICD-10-CM | POA: Diagnosis not present

## 2020-06-01 HISTORY — DX: Headache, unspecified: R51.9

## 2020-06-01 LAB — CBC WITH DIFFERENTIAL/PLATELET
Abs Immature Granulocytes: 0.03 10*3/uL (ref 0.00–0.07)
Basophils Absolute: 0 10*3/uL (ref 0.0–0.1)
Basophils Relative: 0 %
Eosinophils Absolute: 0.1 10*3/uL (ref 0.0–0.5)
Eosinophils Relative: 1 %
HCT: 36.5 % (ref 36.0–46.0)
Hemoglobin: 11.4 g/dL — ABNORMAL LOW (ref 12.0–15.0)
Immature Granulocytes: 0 %
Lymphocytes Relative: 28 %
Lymphs Abs: 2 10*3/uL (ref 0.7–4.0)
MCH: 22.8 pg — ABNORMAL LOW (ref 26.0–34.0)
MCHC: 31.2 g/dL (ref 30.0–36.0)
MCV: 72.9 fL — ABNORMAL LOW (ref 80.0–100.0)
Monocytes Absolute: 0.7 10*3/uL (ref 0.1–1.0)
Monocytes Relative: 9 %
Neutro Abs: 4.5 10*3/uL (ref 1.7–7.7)
Neutrophils Relative %: 62 %
Platelets: 222 10*3/uL (ref 150–400)
RBC: 5.01 MIL/uL (ref 3.87–5.11)
RDW: 15 % (ref 11.5–15.5)
WBC: 7.3 10*3/uL (ref 4.0–10.5)
nRBC: 0 % (ref 0.0–0.2)

## 2020-06-01 LAB — COMPREHENSIVE METABOLIC PANEL
ALT: 11 U/L (ref 0–44)
AST: 21 U/L (ref 15–41)
Albumin: 2.7 g/dL — ABNORMAL LOW (ref 3.5–5.0)
Alkaline Phosphatase: 205 U/L — ABNORMAL HIGH (ref 38–126)
Anion gap: 10 (ref 5–15)
BUN: 9 mg/dL (ref 6–20)
CO2: 20 mmol/L — ABNORMAL LOW (ref 22–32)
Calcium: 8.5 mg/dL — ABNORMAL LOW (ref 8.9–10.3)
Chloride: 105 mmol/L (ref 98–111)
Creatinine, Ser: 0.81 mg/dL (ref 0.44–1.00)
GFR calc Af Amer: >60 mL/min (ref >60)
GFR calc non Af Amer: >60 mL/min (ref >60)
Glucose, Bld: 114 mg/dL — ABNORMAL HIGH (ref 70–99)
Potassium: 3.9 mmol/L (ref 3.5–5.1)
Sodium: 135 mmol/L (ref 135–145)
Total Bilirubin: 0.5 mg/dL (ref 0.3–1.2)
Total Protein: 6.1 g/dL — ABNORMAL LOW (ref 6.5–8.1)

## 2020-06-01 LAB — RESPIRATORY PANEL BY RT PCR (FLU A&B, COVID)
Influenza A by PCR: NEGATIVE
Influenza B by PCR: NEGATIVE
SARS Coronavirus 2 by RT PCR: NEGATIVE

## 2020-06-01 LAB — RUPTURE OF MEMBRANE (ROM)PLUS: Rom Plus: POSITIVE

## 2020-06-01 LAB — TYPE AND SCREEN
ABO/RH(D): B POS
Antibody Screen: NEGATIVE

## 2020-06-01 LAB — GLUCOSE, CAPILLARY
Glucose-Capillary: 104 mg/dL — ABNORMAL HIGH (ref 70–99)
Glucose-Capillary: 116 mg/dL — ABNORMAL HIGH (ref 70–99)
Glucose-Capillary: 122 mg/dL — ABNORMAL HIGH (ref 70–99)

## 2020-06-01 LAB — HEMOGLOBIN A1C
Hgb A1c MFr Bld: 5.9 % — ABNORMAL HIGH (ref 4.8–5.6)
Mean Plasma Glucose: 122.63 mg/dL

## 2020-06-01 MED ORDER — OXYTOCIN BOLUS FROM INFUSION: 333.0000 mL | Freq: Once | INTRAVENOUS | Status: DC

## 2020-06-01 MED ORDER — LACTATED RINGERS IV SOLN: INTRAVENOUS | Status: DC

## 2020-06-01 MED ORDER — LIDOCAINE HCL (PF) 1 % IJ SOLN: 30.0000 mL | INTRAMUSCULAR | Status: DC | PRN

## 2020-06-01 MED ORDER — NIFEDIPINE ER OSMOTIC RELEASE 30 MG PO TB24
60.0000 mg | ORAL_TABLET | Freq: Every day | ORAL | Status: DC
  Administered 2020-06-01 – 2020-06-06 (×5): 60 mg via ORAL
  Filled 2020-06-01 (×5): qty 2

## 2020-06-01 MED ORDER — BUTORPHANOL TARTRATE 1 MG/ML IJ SOLN
1.0000 mg | INTRAMUSCULAR | Status: DC | PRN
  Administered 2020-06-03: 1 mg via INTRAVENOUS
  Filled 2020-06-01: qty 1

## 2020-06-01 MED ORDER — LACTATED RINGERS IV SOLN: 500.0000 mL | INTRAVENOUS | Status: DC | PRN

## 2020-06-01 MED ORDER — MISOPROSTOL 25 MCG QUARTER TABLET: 25.0000 ug | ORAL_TABLET | Freq: Four times a day (QID) | ORAL | Status: DC | PRN

## 2020-06-01 MED ORDER — ACETAMINOPHEN 325 MG PO TABS
650.0000 mg | ORAL_TABLET | ORAL | Status: DC | PRN
  Administered 2020-06-03: 650 mg via ORAL
  Filled 2020-06-01: qty 2

## 2020-06-01 MED ORDER — TERBUTALINE SULFATE 1 MG/ML IJ SOLN: 0.2500 mg | Freq: Once | INTRAMUSCULAR | Status: DC | PRN

## 2020-06-01 MED ORDER — MISOPROSTOL 25 MCG QUARTER TABLET
25.0000 ug | ORAL_TABLET | ORAL | Status: DC | PRN
  Administered 2020-06-01 – 2020-06-02 (×2): 25 ug via VAGINAL
  Administered 2020-06-03: 600 ug via VAGINAL
  Filled 2020-06-01 (×3): qty 1

## 2020-06-01 MED ORDER — MISOPROSTOL 25 MCG QUARTER TABLET
ORAL_TABLET | ORAL | Status: AC
  Administered 2020-06-01: 25 ug via VAGINAL
  Filled 2020-06-01: qty 1

## 2020-06-01 MED ORDER — OXYTOCIN-SODIUM CHLORIDE 30-0.9 UT/500ML-% IV SOLN
2.5000 [IU]/h | INTRAVENOUS | Status: DC
  Administered 2020-06-03: 600 [IU]/h via INTRAVENOUS
  Filled 2020-06-01: qty 500

## 2020-06-01 MED ORDER — OXYCODONE-ACETAMINOPHEN 5-325 MG PO TABS: 2.0000 | ORAL_TABLET | ORAL | Status: DC | PRN

## 2020-06-01 MED ORDER — DEXTROSE 50 % IV SOLN: 0.0000 mL | INTRAVENOUS | Status: DC | PRN

## 2020-06-01 MED ORDER — DEXTROSE IN LACTATED RINGERS 5 % IV SOLN: INTRAVENOUS | Status: DC

## 2020-06-01 MED ORDER — ONDANSETRON HCL 4 MG/2ML IJ SOLN
4.0000 mg | Freq: Four times a day (QID) | INTRAMUSCULAR | Status: DC | PRN
  Administered 2020-06-02: 4 mg via INTRAVENOUS
  Filled 2020-06-01: qty 2

## 2020-06-01 MED ORDER — OXYCODONE-ACETAMINOPHEN 5-325 MG PO TABS: 1.0000 | ORAL_TABLET | ORAL | Status: DC | PRN

## 2020-06-01 MED ORDER — INSULIN REGULAR(HUMAN) IN NACL 100-0.9 UT/100ML-% IV SOLN
INTRAVENOUS | Status: DC
  Administered 2020-06-01: 0.8 [IU]/h via INTRAVENOUS
  Filled 2020-06-01: qty 100

## 2020-06-01 NOTE — Progress Notes (Signed)
Routine Prenatal Care Visit  Subjective  Kelly Stevens is a 29 y.o. G4P0030 at [redacted]w[redacted]d being seen today for ongoing prenatal care.  She is currently monitored for the following issues for this high-risk pregnancy and has Family history of breast cancer; Supervision of high-risk pregnancy; Obesity in pregnancy, antepartum; Chronic hypertension affecting pregnancy; BMI 50.0-59.9, adult (HCC); Gestational diabetes mellitus (GDM) in third trimester; and Labor and delivery, indication for care on their problem list.  ----------------------------------------------------------------------------------- Patient reports that she has been leaking fluid since this morning and thinks that her water has broken.    . Vag. Bleeding: None.  Movement: Present. Denies leaking of fluid.  ----------------------------------------------------------------------------------- The following portions of the patient's history were reviewed and updated as appropriate: allergies, current medications, past family history, past medical history, past social history, past surgical history and problem list. Problem list updated.   Objective  Blood pressure 120/70, height 5\' 6"  (1.676 m), weight (!) 374 lb 3.2 oz (169.7 kg), last menstrual period 09/10/2019. Pregravid weight 336 lb (152.4 kg) Total Weight Gain 38 lb 3.2 oz (17.3 kg) Urinalysis:      Fetal Status:     Movement: Present     General:  Alert, oriented and cooperative. Patient is in no acute distress.  Skin: Skin is warm and dry. No rash noted.   Cardiovascular: Normal heart rate noted  Respiratory: Normal respiratory effort, no problems with respiration noted  Abdomen: Soft, gravid, appropriate for gestational age. Pain/Pressure: Present     Pelvic:  Cervical exam deferred        Extremities: Normal range of motion.     Mental Status: Normal mood and affect. Normal behavior. Normal judgment and thought content.     Assessment   29 y.o. G4P0030 at [redacted]w[redacted]d  by  06/16/2020, by Last Menstrual Period presenting for routine prenatal visit  Plan   Pregnancy #4 Problems (from 11/04/19 to present)    Problem Noted Resolved   BMI 50.0-59.9, adult (HCC) 11/11/2019 by 11/13/2019, MD No   Supervision of high-risk pregnancy 11/04/2019 by 01/04/2020, MD No   Overview Addendum 06/01/2020  5:04 PM by 08/01/2020, MD     Nursing Staff Provider  Office Location  [facility] Dating   LMP=5wk [name]  Language  English Anatomy US   complete  Flu Vaccine  11/04/2019 Genetic Screen  NIPS: Normal XX  TDaP vaccine   04/09/2020 Hgb A1C or  GTT Early : 105 Third trimester : 183; 3hr GTT 104/ 209/ 183/ 102  Rhogam   not needed   LAB RESULTS   Feeding Plan  breast Blood Type B/Positive/-- (03/08 1054)   Contraception  Antibody Negative (03/08 1054)  Circumcision  Rubella <0.90 (03/08 1054) Non-immune  Pediatrician   RPR Non Reactive (03/08 1054)   Support Person  husband HBsAg Negative (03/08 1054)   Prenatal Classes  HIV Non Reactive (03/08 1054)    Varicella  Immune  BTL Consent  GBS  (For PCN allergy, check sensitivities)        VBAC Consent  Pap  2020 NIL    Hgb Electro   normal    CF      SMA         High Risk Pregnancy Diagnoses Gestational Diabetes- insulin controlled Chronic hypertension in pregnancy History of Recurrent miscarriages Obesity in pregnancy        Previous Version   Obesity in pregnancy, antepartum 11/04/2019 by 01/04/2020, MD No  Chronic hypertension affecting pregnancy 11/04/2019 by [name], MD No   Overview Addendum 03/26/2020  9:28 AM by [name], CNM    Initial visit:  Discussed initiation of ASA at [redacted] wks gestation   Will return next wek for BP check, consider initation of antihypertensive if needed     Baseline labs.   11/25/2019 Started procardia XL 30 mg q day 12/23/2019 Increased procardia XL to 60 mg q day  [ ]  Aspirin 81 mg daily after 12 weeks; discontinue after 36  weeks [ ]  baseline labs with CBC, CMP, urine protein/creatinine ratio [ ]  no BP meds unless BPs become elevated [ ]  ultrasound for growth at 28, 32, 36 weeks [ ]  Aspirin 81 mg daily after 12 weeks; discontinue after 36 weeks [ ]  Baseline EKG   Current antihypertensives:  Procardia XL   Baseline and surveillance labs (pulled in from [facility], refresh links as needed)  Lab Results  Component Value Date   PLT 270 03/12/2020   CREATININE 0.86 11/25/2019   AST 17 11/25/2019   ALT 12 11/25/2019    Antenatal Testing CHTN - O10.919  Group I  BP < 140/90, no preeclampsia, AGA,  nml AFV, +/- meds    Group II BP > 140/90, on meds, no preeclampsia, AGA, nml AFV  20-28-34-38  20-24-28-32-35-38  32//2 x wk  28//BPP wkly then 32//2 x wk  40 no meds; 39 meds  PRN or 37  Pre-eclampsia  GHTN - O13.9/Preeclampsia without severe features  - O14.00   Preeclampsia with severe features - O14.10  Q 3-4wks  Q 2 wks  28//BPP wkly then 32//2 x wk  Inpatient  37  PRN or 34        Previous Version       Asked patient to proceed to labor and delivery for evaluation of ROM.  Patient meets criteria for IOL given history of CHTN on medication, gestational diabetes and macrosomia.   NST: 140 bpm baseline, moderate variability, 15x15 accelerations, no decelerations.  Gestational age appropriate obstetric precautions including but not limited to vaginal bleeding, contractions, leaking of fluid and fetal movement were reviewed in detail with the patient.    No follow-ups on file.  11/27/2019 MD [facility], [facility] 06/01/2020, 9:00 PM

## 2020-06-01 NOTE — H&P (Signed)
OB History & Physical   History of Present Illness:  Chief Complaint: fluid leaking  HPI:  Kelly Stevens is a 29 y.o. G39P0030 female at [redacted]w[redacted]d dated by LMP.  Her pregnancy has been complicated by history of recurrent miscarriage,chronic hypertension, obesity, BMI 60, gestational diabetes on insulin, supervision of high risk pregnancy.    She reports occasional contractions.   She reports leakage of fluid.   She denies vaginal bleeding.   She reports fetal movement.    Patient was seen in the office today. She mentioned fluid leaking today and was sent for delivery due to her pregnancy complications and possible SROM.  Total weight gain for pregnancy: 17.3 kg   Obstetrical Problem List: Pregnancy #4 Problems (from 11/04/19 to present)    Problem Noted Resolved   BMI 50.0-59.9, adult (HCC) 11/11/2019 by Conard Novak, MD No   Supervision of high-risk pregnancy 11/04/2019 by [name], MD No   Overview Addendum 06/01/2020  5:04 PM by [name], MD     Nursing Staff Provider  Office Location  [facility] Dating   LMP=5wk [place]  Language  English Anatomy US   complete  Flu Vaccine  11/04/2019 Genetic Screen  NIPS: Normal XX  TDaP vaccine   04/09/2020 Hgb A1C or  GTT Early : 105 Third trimester : 183; 3hr GTT 104/ 209/ 183/ 102  Rhogam   not needed   LAB RESULTS   Feeding Plan  breast Blood Type B/Positive/-- (03/08 1054)   Contraception  Antibody Negative (03/08 1054)  Circumcision  Rubella <0.90 (03/08 1054) Non-immune  Pediatrician   RPR Non Reactive (03/08 1054)   Support Person  husband HBsAg Negative (03/08 1054)   Prenatal Classes  HIV Non Reactive (03/08 1054)    Varicella  Immune  BTL Consent  GBS GC/CT Negative Negative/Negative       VBAC Consent  Pap  2020 NIL    Hgb Electro   normal    CF      SMA         High Risk Pregnancy Diagnoses Gestational Diabetes- insulin controlled Chronic hypertension in pregnancy History of Recurrent  miscarriages Obesity in pregnancy        Previous Version   Obesity in pregnancy, antepartum 11/04/2019 by [name], MD No   Chronic hypertension affecting pregnancy 11/04/2019 by [name], MD No   Overview Addendum 03/26/2020  9:28 AM by [name], CNM    Initial visit:  Discussed initiation of ASA at [redacted] wks gestation   Will return next wek for BP check, consider initation of antihypertensive if needed     Baseline labs.   11/25/2019 Started procardia XL 30 mg q day 12/23/2019 Increased procardia XL to 60 mg q day  [ ]  Aspirin 81 mg daily after 12 weeks; discontinue after 36 weeks [ ]  baseline labs with CBC, CMP, urine protein/creatinine ratio [ ]  no BP meds unless BPs become elevated [ ]  ultrasound for growth at 28, 32, 36 weeks [ ]  Aspirin 81 mg daily after 12 weeks; discontinue after 36 weeks [ ]  Baseline EKG   Current antihypertensives:  Procardia XL   Baseline and surveillance labs (pulled in from [facility], refresh links as needed)  Lab Results  Component Value Date   PLT 270 03/12/2020   CREATININE 0.86 11/25/2019   AST 17 11/25/2019   ALT 12 11/25/2019    Antenatal Testing CHTN - O10.919  Group I  BP < 140/90, no preeclampsia,  AGA,  nml AFV, +/- meds    Group II BP > 140/90, on meds, no preeclampsia, AGA, nml AFV  20-28-34-38  20-24-28-32-35-38  32//2 x wk  28//BPP wkly then 32//2 x wk  40 no meds; 39 meds  PRN or 37  Pre-eclampsia  GHTN - O13.9/Preeclampsia without severe features  - O14.00   Preeclampsia with severe features - O14.10  Q 3-4wks  Q 2 wks  28//BPP wkly then 32//2 x wk  Inpatient  37  PRN or 34        Previous Version       Maternal Medical History:   Past Medical History:  Diagnosis Date  . BMI 50.0-59.9, adult ([place])   . Gestational diabetes   . Habitual aborter   . Headache     History reviewed. No pertinent surgical history.  Allergies  Allergen Reactions  . Nickel Rash    Prior to  Admission medications   Medication Sig Start Date End Date Taking? Authorizing Provider  Accu-Chek Softclix Lancets lancets Use as instructed 04/02/20  Yes Rod Can, CNM  aspirin 81 MG chewable tablet Chew 81 mg by mouth daily.   Yes [provider]  Blood Pressure KIT 1 each by Does not apply route in the morning and at bedtime. 12/23/19  Yes Schuman, Christanna R, MD  glucose blood (ACCU-CHEK GUIDE) test strip Use as instructed 04/02/20  Yes Rod Can, CNM  insulin detemir (LEVEMIR) 100 UNIT/ML injection Inject 0.05 mLs (5 Units total) into the skin at bedtime. 04/24/20  Yes Will Bonnet, MD  NIFEdipine (PROCARDIA XL) 60 MG 24 hr tablet Take 1 tablet (60 mg total) by mouth daily. 12/23/19  Yes Schuman, Stefanie Libel, MD  Prenatal Vit-Fe Fumarate-FA (PRENATAL MULTIVITAMIN) TABS tablet Take 1 tablet by mouth daily at 12 noon.   Yes [provider]  promethazine (PHENERGAN) 25 MG tablet Take 1 tablet (25 mg total) by mouth every 6 (six) hours as needed for nausea or vomiting. 11/25/19  Yes Schuman, Stefanie Libel, MD  SYRINGE/NEEDLE, DISP, 1 ML 25G X 5/8" 1 ML MISC Use 1 syringe per night for insulin injection 04/29/20  Yes Rod Can, CNM    OB History  Gravida Para Term Preterm AB Living  4       3    SAB TAB Ectopic Multiple Live Births  3            # Outcome Date GA Lbr Len/2nd Weight Sex Delivery Anes PTL Lv  4 Current           3 SAB 12/28/18          2 SAB 12/11/17          1 SAB 08/29/17            Prenatal care site: [facility]  Social History: She  reports that she quit smoking about 2 years ago. Her smoking use included cigarettes. She has a 0.40 pack-year smoking history. She has quit using smokeless tobacco. She reports that she does not drink alcohol and does not use drugs.  Family History: family history includes Breast cancer (age of onset: 63) in her maternal aunt; Diabetes in her father, maternal aunt, maternal grandmother, and mother;  Heart disease in her maternal grandmother; Hyperlipidemia in her maternal grandmother; Hypertension in her father, maternal grandmother, and mother; Uterine cancer (age of onset: 42) in her maternal aunt.   Review of Systems:  Review of Systems  Constitutional: Negative for chills and  fever.  HENT: Negative for congestion, ear discharge, ear pain, hearing loss, sinus pain and sore throat.   Eyes: Negative for blurred vision and double vision.  Respiratory: Negative for cough, shortness of breath and wheezing.   Cardiovascular: Negative for chest pain, palpitations and leg swelling.  Gastrointestinal: Negative for abdominal pain, blood in stool, constipation, diarrhea, heartburn, melena, nausea and vomiting.  Genitourinary: Negative for dysuria, flank pain, frequency, hematuria and urgency.  Musculoskeletal: Negative for back pain, joint pain and myalgias.  Skin: Negative for itching and rash.  Neurological: Negative for dizziness, tingling, tremors, sensory change, speech change, focal weakness, seizures, loss of consciousness, weakness and headaches.  Endo/Heme/Allergies: Negative for environmental allergies. Does not bruise/bleed easily.  Psychiatric/Behavioral: Negative for depression, hallucinations, memory loss, substance abuse and suicidal ideas. The patient is not nervous/anxious and does not have insomnia.      Physical Exam:  BP (!) 143/95 (BP Location: Right Arm)   Pulse (!) 113   Temp 98.4 F (36.9 C) (Oral)   Resp 16   Ht _0  (1.676 m)   Wt (!) 169.7 kg   LMP 09/10/2019 (Exact Date)   BMI 60.40 kg/m   Constitutional: Well nourished, well developed female in no acute distress.  HEENT: normal Skin: Warm and dry.  Cardiovascular: Regular rate and rhythm.   Extremity: 1+ edema  Respiratory: Clear to auscultation bilateral. Normal respiratory effort Abdomen: FHT present Back: no CVAT Neuro: DTRs 2+, Cranial nerves grossly intact Psych: Alert and Oriented x3. No memory  deficits. Normal mood and affect.    Pelvic exam: (female chaperone present) is limited by body habitus EGBUS: within normal limits Vagina: within normal limits and with normal mucosa  Cervix: difficult to reach 0.5/50/-3   Baseline FHR: 140 beats/min   Variability: moderate   Accelerations: present   Decelerations: absent Contractions: present frequency: every 3-8 Overall assessment: reassuring  Results for Kelly Stevens, Kelly Stevens (MRN [id number]) as of 06/01/2020 20:48  Ref. Range 06/01/2020 17:35 06/01/2020 17:52 06/01/2020 20:15  Glucose-Capillary Latest Ref Range: 70 - 99 mg/dL   104 (H)  COMPREHENSIVE METABOLIC PANEL Unknown  Rpt (A)   Sodium Latest Ref Range: 135 - 145 mmol/L  135   Potassium Latest Ref Range: 3.5 - 5.1 mmol/L  3.9   Chloride Latest Ref Range: 98 - 111 mmol/L  105   CO2 Latest Ref Range: 22 - 32 mmol/L  20 (L)   Glucose Latest Ref Range: 70 - 99 mg/dL  114 (H)   BUN Latest Ref Range: 6 - 20 mg/dL  9   Creatinine Latest Ref Range: 0.44 - 1.00 mg/dL  0.81   Calcium Latest Ref Range: 8.9 - 10.3 mg/dL  8.5 (L)   Anion gap Latest Ref Range: 5 - 15   10   Alkaline Phosphatase Latest Ref Range: 38 - 126 U/L  205 (H)   Albumin Latest Ref Range: 3.5 - 5.0 g/dL  2.7 (L)   AST Latest Ref Range: 15 - 41 U/L  21   ALT Latest Ref Range: 0 - 44 U/L  11   Total Protein Latest Ref Range: 6.5 - 8.1 g/dL  6.1 (L)   Total Bilirubin Latest Ref Range: 0.3 - 1.2 mg/dL  0.5   GFR, Est Non African American Latest Ref Range: >60 mL/min  >60   GFR, Est African American Latest Ref Range: >60 mL/min  >60   WBC Latest Ref Range: 4.0 - 10.5 K/uL  7.3   RBC Latest Ref Range: 3.87 -  5.11 MIL/uL  5.01   Hemoglobin Latest Ref Range: 12.0 - 15.0 g/dL  11.4 (L)   HCT Latest Ref Range: 36 - 46 %  36.5   MCV Latest Ref Range: 80.0 - 100.0 fL  72.9 (L)   MCH Latest Ref Range: 26.0 - 34.0 pg  22.8 (L)   MCHC Latest Ref Range: 30.0 - 36.0 g/dL  31.2   RDW Latest Ref Range: 11.5 - 15.5 %  15.0    Platelets Latest Ref Range: 150 - 400 K/uL  222   nRBC Latest Ref Range: 0.0 - 0.2 %  0.0   Neutrophils Latest Units: %  62   Lymphocytes Latest Units: %  28   Monocytes Relative Latest Units: %  9   Eosinophil Latest Units: %  1   Basophil Latest Units: %  0   Immature Granulocytes Latest Units: %  0   NEUT# Latest Ref Range: 1.7 - 7.7 K/uL  4.5   Lymphocyte # Latest Ref Range: 0.7 - 4.0 K/uL  2.0   Monocyte # Latest Ref Range: 0 - 1 K/uL  0.7   Eosinophils Absolute Latest Ref Range: 0 - 0 K/uL  0.1   Basophils Absolute Latest Ref Range: 0 - 0 K/uL  0.0   Abs Immature Granulocytes Latest Ref Range: 0.00 - 0.07 K/uL  0.03   Sample Expiration Unknown  06/04/2020,2359...   Antibody Screen Unknown  NEG   ABO/RH(D) Unknown  B POS   RESPIRATORY PANEL BY RT PCR (FLU A&B, COVID) Unknown Rpt    Influenza A By PCR Latest Ref Range: NEGATIVE  NEGATIVE    Influenza B By PCR Latest Ref Range: NEGATIVE  NEGATIVE    SARS Coronavirus 2 by RT PCR Latest Ref Range: NEGATIVE  NEGATIVE    Rom Plus Unknown POSITIVE        Lab Results  Component Value Date   [place] NEGATIVE 06/01/2020  ]  Assessment:  Kelly Stevens is a 29 y.o. G51P0030 female at 84w6dwith induction for SROM, chronic hypertension, insulin controlled GDM.   Plan:  1. Admit to Labor & Delivery  2. CBC, T&S, CMP, Hgb A1C, Clrs, IVF 3. GBS negative.   4. Fetal well-being: Category I 5. Chronic Hypertension: continue Procardia 60 mg XL, monitor for severe range pressures 6. Insulin controlled GDM: Endo Tool   JRod Can CNorth Dakota10/11/2019 8:20 PM

## 2020-06-02 LAB — BASIC METABOLIC PANEL
Anion gap: 10 (ref 5–15)
BUN: 9 mg/dL (ref 6–20)
CO2: 21 mmol/L — ABNORMAL LOW (ref 22–32)
Calcium: 8.7 mg/dL — ABNORMAL LOW (ref 8.9–10.3)
Chloride: 104 mmol/L (ref 98–111)
Creatinine, Ser: 1.04 mg/dL — ABNORMAL HIGH (ref 0.44–1.00)
GFR calc Af Amer: >60 mL/min (ref >60)
GFR calc non Af Amer: >60 mL/min (ref >60)
Glucose, Bld: 104 mg/dL — ABNORMAL HIGH (ref 70–99)
Potassium: 3.8 mmol/L (ref 3.5–5.1)
Sodium: 135 mmol/L (ref 135–145)

## 2020-06-02 LAB — PROTEIN / CREATININE RATIO, URINE
Creatinine, Urine: 221 mg/dL
Protein Creatinine Ratio: 0.15 mg/mg{Cre} (ref 0.00–0.15)
Total Protein, Urine: 33 mg/dL

## 2020-06-02 LAB — GLUCOSE, CAPILLARY
Glucose-Capillary: 101 mg/dL — ABNORMAL HIGH (ref 70–99)
Glucose-Capillary: 104 mg/dL — ABNORMAL HIGH (ref 70–99)
Glucose-Capillary: 105 mg/dL — ABNORMAL HIGH (ref 70–99)
Glucose-Capillary: 107 mg/dL — ABNORMAL HIGH (ref 70–99)
Glucose-Capillary: 111 mg/dL — ABNORMAL HIGH (ref 70–99)
Glucose-Capillary: 114 mg/dL — ABNORMAL HIGH (ref 70–99)
Glucose-Capillary: 114 mg/dL — ABNORMAL HIGH (ref 70–99)
Glucose-Capillary: 117 mg/dL — ABNORMAL HIGH (ref 70–99)
Glucose-Capillary: 118 mg/dL — ABNORMAL HIGH (ref 70–99)
Glucose-Capillary: 119 mg/dL — ABNORMAL HIGH (ref 70–99)
Glucose-Capillary: 125 mg/dL — ABNORMAL HIGH (ref 70–99)
Glucose-Capillary: 135 mg/dL — ABNORMAL HIGH (ref 70–99)
Glucose-Capillary: 139 mg/dL — ABNORMAL HIGH (ref 70–99)
Glucose-Capillary: 93 mg/dL (ref 70–99)
Glucose-Capillary: 96 mg/dL (ref 70–99)

## 2020-06-02 LAB — RPR: RPR Ser Ql: NONREACTIVE

## 2020-06-02 MED ORDER — FAMOTIDINE IN NACL 20-0.9 MG/50ML-% IV SOLN
20.0000 mg | Freq: Two times a day (BID) | INTRAVENOUS | Status: DC
  Administered 2020-06-02: 20 mg via INTRAVENOUS
  Filled 2020-06-02 (×2): qty 50

## 2020-06-02 MED ORDER — TERBUTALINE SULFATE 1 MG/ML IJ SOLN: 0.2500 mg | Freq: Once | INTRAMUSCULAR | Status: DC | PRN

## 2020-06-02 MED ORDER — OXYTOCIN-SODIUM CHLORIDE 30-0.9 UT/500ML-% IV SOLN
1.0000 m[IU]/min | INTRAVENOUS | Status: DC
  Administered 2020-06-02: 2 m[IU]/min via INTRAVENOUS

## 2020-06-02 NOTE — Progress Notes (Signed)
  Labor Progress Note   29 y.o. year old G4P0030 at [redacted]w[redacted]d weeks gestation admitted for PROM.  Subjective:  Patient is resting in bed after eating. IV infiltrated and nurse staffing difficulties resulted in pitocin being off for several hours. Patient was able to eat dinner during this time. New IV was established. Pitocin has been restarted.   Objective:   Vitals:   06/02/20 0956 06/02/20 1000  BP: 133/89   Pulse: 91   Resp:    Temp:  98.1 F (36.7 C)    Abd: non-tender Extr: no edema  EFM: FHR: 120 bpm, variability: moderate,  accelerations:  Present,  decelerations:  Absent Toco: Frequency: every 5-6 minutes   Assessment & Plan:  29 y.o. year old G4P0030 at [redacted]w[redacted]d weeks gestation admitted for PROM Pregnancy and Labor/Delivery Management  1. Pain management: epidural or stadol if desired 2. FWB: FHT category 1 3. ID: GBS Negative 4. Labor management: Pitocin is being restarted.  5. Blood glucose; well controlled on endotool 6. Chronic HTN- blood pressures WNL.   All discussed with patient, see orders  Adelene Idler MD [facility], [facility] 06/02/2020 9:20 PM

## 2020-06-02 NOTE — Progress Notes (Signed)
Inpatient Diabetes Program Recommendations  Diabetes Treatment Program Recommendations  ADA Standards of Care Diabetes in Pregnancy Target Glucose Ranges:  Fasting: 60 - 90 mg/dL Preprandial: 60 - 829 mg/dL 1 hr postprandial: Less than 140mg /dL (from first bite of meal) 2 hr postprandial: Less than 120 mg/dL (from first bite of meal)   Results for Kelly Stevens, Kelly Stevens (MRN [place] Saver) as of 06/02/2020 08:24  Ref. Range 06/02/2020 00:05 06/02/2020 01:09 06/02/2020 03:23 06/02/2020 05:32 06/02/2020 07:40  Glucose-Capillary Latest Ref Range: 70 - 99 mg/dL 08/02/2020 (H) 938 (H) 101 (H) 93 118 (H)   Review of Glycemic Control  Diabetes history: GDM; 37W Outpatient Diabetes medications: Levemir 5 units QHS Current orders for Inpatient glycemic control: IV insulin  Inpatient Diabetes Program Recommendations:    Insulin: IV insulin should be continued until after delivery. Once patient has delivered and IV insulin is going to be stopped, recommend checking CBGs AC&HS and ordering Novolog 0-9 units TID with meals and Novolog 0-5 units QHS so glucose can be monitored and corrected if needed.  NOTE: Noted consult for diabetes coordinator. Per chart, patient is 37W and has GDM and taking Levemir 5 units QHS as an outpatient. Patient was started on IV insulin 06/01/20 and remains on IV insulin at this time. Recommend to continue IV insulin until patient delivers. After delivery would recommend ordering CBGs AC&HS and Novolog 0-9 units TID with meals and Novolog 0-5 units QHS to monitor glucose and correct if needed. Will continue to follow along.  Thanks, 08/01/20, RN, MSN, CDE Diabetes Coordinator Inpatient Diabetes Program [phone] (Team Pager from 8am to 5pm)

## 2020-06-02 NOTE — Progress Notes (Signed)
Patient sleeping, was recently started on pitocin. Was able to eat breakfast.   SVE 1/20/-3 Foley bulb placed and inflated with 30 cc of saline Patient tolerated to procedure well.  Continue with pitocin for induction.   Adelene Idler MD, [facility], [facility] 06/02/2020 11:20 AM

## 2020-06-02 NOTE — Progress Notes (Signed)
  Labor Progress Note   29 y.o. year old G4P0030 at [redacted]w[redacted]d weeks gestation admitted for premature rupture of membranes  Subjective:  Patient resting comfortably. Reports contractions were stronger when she was walking.   Objective:   Vitals:   06/02/20 0956 06/02/20 1000  BP: 133/89   Pulse: 91   Resp:    Temp:  98.1 F (36.7 C)    Abd: non-tender Extr: no edema  EFM: FHR: 140 bpm, variability: moderate,  accelerations present,  decelerations:absent Toco: Frequency: every 2-4 minutes  SVE: 2/50/-3 IUPC placed without complication.   Assessment & Plan:  29 y.o. year old G4P0030 at [redacted]w[redacted]d weeks gestation admitted for pre Pregnancy and Labor/Delivery Management  1. Pain management:  2. FWB: FHT category 1 3. ID: GBS Negative 4. Labor management: Continue with pitocin for augmentation of labor. Discussed labor progress which has been slow with a prolonged latent phase. Discussed cesarean section as a possibility if she does not make cervical change by the morning.   All discussed with patient, see orders  Adelene Idler MD [facility], [facility] 06/02/2020 5:18 PM

## 2020-06-03 ENCOUNTER — Inpatient Hospital Stay: Payer: Medicaid Other | Admitting: Anesthesiology

## 2020-06-03 ENCOUNTER — Encounter
Admission: EM | Disposition: A | Discharge status: Home / Self Care | Payer: Self-pay | Source: Home / Self Care | Attending: Obstetrics & Gynecology

## 2020-06-03 ENCOUNTER — Encounter: Payer: Self-pay | Admitting: Advanced Practice Midwife

## 2020-06-03 DIAGNOSIS — O99214 Obesity complicating childbirth: Secondary | ICD-10-CM

## 2020-06-03 DIAGNOSIS — O411230 Chorioamnionitis, third trimester, not applicable or unspecified: Secondary | ICD-10-CM

## 2020-06-03 DIAGNOSIS — E669 Obesity, unspecified: Secondary | ICD-10-CM

## 2020-06-03 DIAGNOSIS — O24424 Gestational diabetes mellitus in childbirth, insulin controlled: Secondary | ICD-10-CM

## 2020-06-03 DIAGNOSIS — O1092 Unspecified pre-existing hypertension complicating childbirth: Secondary | ICD-10-CM

## 2020-06-03 LAB — BASIC METABOLIC PANEL
Anion gap: 13 (ref 5–15)
BUN: 12 mg/dL (ref 6–20)
CO2: 17 mmol/L — ABNORMAL LOW (ref 22–32)
Calcium: 8.6 mg/dL — ABNORMAL LOW (ref 8.9–10.3)
Chloride: 103 mmol/L (ref 98–111)
Creatinine, Ser: 1.17 mg/dL — ABNORMAL HIGH (ref 0.44–1.00)
GFR calc non Af Amer: >60 mL/min (ref >60)
Glucose, Bld: 112 mg/dL — ABNORMAL HIGH (ref 70–99)
Potassium: 3.7 mmol/L (ref 3.5–5.1)
Sodium: 133 mmol/L — ABNORMAL LOW (ref 135–145)

## 2020-06-03 LAB — GLUCOSE, CAPILLARY
Glucose-Capillary: 102 mg/dL — ABNORMAL HIGH (ref 70–99)
Glucose-Capillary: 104 mg/dL — ABNORMAL HIGH (ref 70–99)
Glucose-Capillary: 108 mg/dL — ABNORMAL HIGH (ref 70–99)
Glucose-Capillary: 108 mg/dL — ABNORMAL HIGH (ref 70–99)
Glucose-Capillary: 116 mg/dL — ABNORMAL HIGH (ref 70–99)
Glucose-Capillary: 118 mg/dL — ABNORMAL HIGH (ref 70–99)
Glucose-Capillary: 98 mg/dL (ref 70–99)

## 2020-06-03 SURGERY — Surgical Case
Anesthesia: Epidural

## 2020-06-03 MED ORDER — CARBOPROST TROMETHAMINE 250 MCG/ML IM SOLN
INTRAMUSCULAR | Status: DC | PRN
  Administered 2020-06-03: 250 ug via INTRAMUSCULAR

## 2020-06-03 MED ORDER — BUPIVACAINE 0.25 % ON-Q PUMP DUAL CATH 400 ML
400.0000 mL | INJECTION | Status: DC
  Filled 2020-06-03: qty 400

## 2020-06-03 MED ORDER — KETOROLAC TROMETHAMINE 30 MG/ML IJ SOLN
30.0000 mg | Freq: Four times a day (QID) | INTRAMUSCULAR | Status: DC
  Administered 2020-06-03 – 2020-06-04 (×3): 30 mg via INTRAVENOUS
  Filled 2020-06-03 (×3): qty 1

## 2020-06-03 MED ORDER — FENTANYL 2.5 MCG/ML W/ROPIVACAINE 0.15% IN NS 100 ML EPIDURAL (ARMC)
12.0000 mL/h | EPIDURAL | Status: DC
  Administered 2020-06-03: 12 mL/h via EPIDURAL
  Filled 2020-06-03: qty 100

## 2020-06-03 MED ORDER — PHENYLEPHRINE 40 MCG/ML (10ML) SYRINGE FOR IV PUSH (FOR BLOOD PRESSURE SUPPORT): 80.0000 ug | PREFILLED_SYRINGE | INTRAVENOUS | Status: DC | PRN

## 2020-06-03 MED ORDER — KETOROLAC TROMETHAMINE 30 MG/ML IJ SOLN: 30.0000 mg | Freq: Four times a day (QID) | INTRAMUSCULAR | Status: DC

## 2020-06-03 MED ORDER — DIBUCAINE (PERIANAL) 1 % EX OINT: 1.0000 "application " | TOPICAL_OINTMENT | CUTANEOUS | Status: DC | PRN

## 2020-06-03 MED ORDER — MEASLES, MUMPS & RUBELLA VAC IJ SOLR
0.5000 mL | Freq: Once | INTRAMUSCULAR | Status: AC
  Administered 2020-06-06: 0.5 mL via SUBCUTANEOUS
  Filled 2020-06-03 (×3): qty 0.5

## 2020-06-03 MED ORDER — MEPERIDINE HCL 25 MG/ML IJ SOLN
6.2500 mg | INTRAMUSCULAR | Status: AC | PRN
  Administered 2020-06-03 (×2): 6.25 mg via INTRAVENOUS
  Filled 2020-06-03 (×2): qty 1

## 2020-06-03 MED ORDER — LIDOCAINE HCL (PF) 2 % IJ SOLN
INTRAMUSCULAR | Status: AC
  Filled 2020-06-03: qty 10

## 2020-06-03 MED ORDER — NALOXONE HCL 4 MG/10ML IJ SOLN
1.0000 ug/kg/h | INTRAVENOUS | Status: DC | PRN
  Filled 2020-06-03: qty 5

## 2020-06-03 MED ORDER — ONDANSETRON HCL 4 MG/2ML IJ SOLN: 4.0000 mg | Freq: Three times a day (TID) | INTRAMUSCULAR | Status: DC | PRN

## 2020-06-03 MED ORDER — SIMETHICONE 80 MG PO CHEW
80.0000 mg | CHEWABLE_TABLET | Freq: Three times a day (TID) | ORAL | Status: DC
  Administered 2020-06-03 – 2020-06-06 (×10): 80 mg via ORAL
  Filled 2020-06-03 (×8): qty 1

## 2020-06-03 MED ORDER — EPHEDRINE 5 MG/ML INJ: 10.0000 mg | INTRAVENOUS | Status: DC | PRN

## 2020-06-03 MED ORDER — NALBUPHINE HCL 10 MG/ML IJ SOLN: 5.0000 mg | Freq: Once | INTRAMUSCULAR | Status: DC | PRN

## 2020-06-03 MED ORDER — SIMETHICONE 80 MG PO CHEW: 80.0000 mg | CHEWABLE_TABLET | ORAL | Status: DC | PRN

## 2020-06-03 MED ORDER — DIPHENHYDRAMINE HCL 25 MG PO CAPS: 25.0000 mg | ORAL_CAPSULE | ORAL | Status: DC | PRN

## 2020-06-03 MED ORDER — MORPHINE SULFATE (PF) 0.5 MG/ML IJ SOLN
INTRAMUSCULAR | Status: AC
Start: 2020-06-03 — End: ?
  Filled 2020-06-03: qty 10

## 2020-06-03 MED ORDER — PHENYLEPHRINE HCL (PRESSORS) 10 MG/ML IV SOLN
INTRAVENOUS | Status: DC | PRN
  Administered 2020-06-03 (×3): 100 ug via INTRAVENOUS

## 2020-06-03 MED ORDER — MORPHINE SULFATE (PF) 2 MG/ML IV SOLN: 1.0000 mg | INTRAVENOUS | Status: DC | PRN

## 2020-06-03 MED ORDER — BUPIVACAINE ON-Q PAIN PUMP (FOR ORDER SET NO CHG): INJECTION | Status: AC

## 2020-06-03 MED ORDER — SODIUM CHLORIDE 0.9 % IV SOLN
2.0000 g | Freq: Four times a day (QID) | INTRAVENOUS | Status: DC
  Administered 2020-06-03: 2 g via INTRAVENOUS
  Filled 2020-06-03: qty 2000

## 2020-06-03 MED ORDER — IBUPROFEN 800 MG PO TABS
800.0000 mg | ORAL_TABLET | Freq: Four times a day (QID) | ORAL | Status: DC
  Administered 2020-06-04 – 2020-06-05 (×3): 800 mg via ORAL
  Filled 2020-06-03 (×3): qty 1

## 2020-06-03 MED ORDER — BUPIVACAINE HCL (PF) 0.5 % IJ SOLN
INTRAMUSCULAR | Status: DC | PRN
  Administered 2020-06-03: 10 mL

## 2020-06-03 MED ORDER — GENTAMICIN SULFATE 40 MG/ML IJ SOLN
250.0000 mg | Freq: Once | INTRAVENOUS | Status: AC
  Administered 2020-06-03: 250 mg via INTRAVENOUS
  Filled 2020-06-03: qty 6.25

## 2020-06-03 MED ORDER — LACTATED RINGERS IV SOLN: INTRAVENOUS | Status: DC

## 2020-06-03 MED ORDER — SODIUM CHLORIDE 0.9 % IV SOLN
INTRAVENOUS | Status: DC | PRN
  Administered 2020-06-03: 10 mL via EPIDURAL

## 2020-06-03 MED ORDER — ACETAMINOPHEN 325 MG PO TABS
650.0000 mg | ORAL_TABLET | ORAL | Status: DC | PRN
  Administered 2020-06-05: 650 mg via ORAL
  Filled 2020-06-03: qty 2

## 2020-06-03 MED ORDER — MENTHOL 3 MG MT LOZG
1.0000 | LOZENGE | OROMUCOSAL | Status: DC | PRN
  Filled 2020-06-03: qty 9

## 2020-06-03 MED ORDER — FENTANYL CITRATE (PF) 100 MCG/2ML IJ SOLN
INTRAMUSCULAR | Status: DC | PRN
Start: 2020-06-03 — End: 2020-06-03
  Administered 2020-06-03: 100 ug via EPIDURAL

## 2020-06-03 MED ORDER — FENTANYL 2.5 MCG/ML W/ROPIVACAINE 0.15% IN NS 100 ML EPIDURAL (ARMC)
EPIDURAL | Status: AC
  Filled 2020-06-03: qty 100

## 2020-06-03 MED ORDER — DIPHENHYDRAMINE HCL 25 MG PO CAPS: 25.0000 mg | ORAL_CAPSULE | Freq: Four times a day (QID) | ORAL | Status: DC | PRN

## 2020-06-03 MED ORDER — CARBOPROST TROMETHAMINE 250 MCG/ML IM SOLN
INTRAMUSCULAR | Status: AC
  Filled 2020-06-03: qty 1

## 2020-06-03 MED ORDER — CLINDAMYCIN PHOSPHATE 900 MG/50ML IV SOLN
900.0000 mg | Freq: Three times a day (TID) | INTRAVENOUS | Status: DC
  Administered 2020-06-03 – 2020-06-04 (×3): 900 mg via INTRAVENOUS
  Filled 2020-06-03 (×5): qty 50

## 2020-06-03 MED ORDER — SODIUM CHLORIDE 0.9 % IV SOLN
2.0000 g | INTRAVENOUS | Status: AC
  Administered 2020-06-03: 2 g via INTRAVENOUS

## 2020-06-03 MED ORDER — NALOXONE HCL 0.4 MG/ML IJ SOLN: 0.4000 mg | INTRAMUSCULAR | Status: DC | PRN

## 2020-06-03 MED ORDER — SIMETHICONE 80 MG PO CHEW
80.0000 mg | CHEWABLE_TABLET | ORAL | Status: DC
  Administered 2020-06-04: 80 mg via ORAL
  Filled 2020-06-03 (×3): qty 1

## 2020-06-03 MED ORDER — FENTANYL 2.5 MCG/ML W/ROPIVACAINE 0.15% IN NS 100 ML EPIDURAL (ARMC)
EPIDURAL | Status: DC | PRN
Start: 2020-06-03 — End: 2020-06-03
  Administered 2020-06-03: 12 mL/h via EPIDURAL

## 2020-06-03 MED ORDER — DIPHENHYDRAMINE HCL 50 MG/ML IJ SOLN: 12.5000 mg | INTRAMUSCULAR | Status: DC | PRN

## 2020-06-03 MED ORDER — NALBUPHINE HCL 10 MG/ML IJ SOLN: 5.0000 mg | INTRAMUSCULAR | Status: DC | PRN

## 2020-06-03 MED ORDER — POVIDONE-IODINE 10 % EX SWAB: 2.0000 "application " | Freq: Once | CUTANEOUS | Status: DC

## 2020-06-03 MED ORDER — SODIUM CHLORIDE 0.9 % IV SOLN
2.0000 g | Freq: Four times a day (QID) | INTRAVENOUS | Status: DC
  Administered 2020-06-03 – 2020-06-04 (×4): 2 g via INTRAVENOUS
  Filled 2020-06-03: qty 2000
  Filled 2020-06-03 (×2): qty 2
  Filled 2020-06-03 (×2): qty 2000
  Filled 2020-06-03: qty 2
  Filled 2020-06-03: qty 2000

## 2020-06-03 MED ORDER — LIDOCAINE-EPINEPHRINE (PF) 1.5 %-1:200000 IJ SOLN
INTRAMUSCULAR | Status: DC | PRN
  Administered 2020-06-03: 3 mL via EPIDURAL

## 2020-06-03 MED ORDER — MISOPROSTOL 200 MCG PO TABS
ORAL_TABLET | ORAL | Status: AC
  Filled 2020-06-03: qty 4

## 2020-06-03 MED ORDER — SODIUM CHLORIDE 0.9 % IV SOLN
INTRAVENOUS | Status: AC
  Filled 2020-06-03: qty 2

## 2020-06-03 MED ORDER — FENTANYL CITRATE (PF) 100 MCG/2ML IJ SOLN
INTRAMUSCULAR | Status: AC
  Filled 2020-06-03: qty 2

## 2020-06-03 MED ORDER — SENNOSIDES-DOCUSATE SODIUM 8.6-50 MG PO TABS
2.0000 | ORAL_TABLET | ORAL | Status: DC
  Administered 2020-06-04 – 2020-06-06 (×3): 2 via ORAL
  Filled 2020-06-03 (×3): qty 2

## 2020-06-03 MED ORDER — SOD CITRATE-CITRIC ACID 500-334 MG/5ML PO SOLN
30.0000 mL | ORAL | Status: AC
  Administered 2020-06-03: 30 mL via ORAL

## 2020-06-03 MED ORDER — LACTATED RINGERS IV SOLN: INTRAVENOUS | Status: DC | PRN

## 2020-06-03 MED ORDER — CLINDAMYCIN PHOSPHATE 900 MG/50ML IV SOLN
900.0000 mg | Freq: Three times a day (TID) | INTRAVENOUS | Status: DC
  Administered 2020-06-03: 900 mg via INTRAVENOUS
  Filled 2020-06-03: qty 50

## 2020-06-03 MED ORDER — ENOXAPARIN SODIUM 40 MG/0.4ML ~~LOC~~ SOLN
40.0000 mg | SUBCUTANEOUS | Status: DC
  Administered 2020-06-04 – 2020-06-05 (×2): 40 mg via SUBCUTANEOUS
  Filled 2020-06-03 (×2): qty 0.4

## 2020-06-03 MED ORDER — BUPIVACAINE HCL (PF) 0.5 % IJ SOLN
INTRAMUSCULAR | Status: AC
  Filled 2020-06-03: qty 30

## 2020-06-03 MED ORDER — PHENYLEPHRINE HCL (PRESSORS) 10 MG/ML IV SOLN
INTRAVENOUS | Status: AC
  Filled 2020-06-03: qty 1

## 2020-06-03 MED ORDER — LACTATED RINGERS IV SOLN
500.0000 mL | Freq: Once | INTRAVENOUS | Status: AC
  Administered 2020-06-03: 500 mL via INTRAVENOUS

## 2020-06-03 MED ORDER — SOD CITRATE-CITRIC ACID 500-334 MG/5ML PO SOLN
ORAL | Status: AC
  Filled 2020-06-03: qty 30

## 2020-06-03 MED ORDER — OXYTOCIN-SODIUM CHLORIDE 30-0.9 UT/500ML-% IV SOLN
2.5000 [IU]/h | INTRAVENOUS | Status: AC
  Administered 2020-06-03 (×2): 2.5 [IU]/h via INTRAVENOUS

## 2020-06-03 MED ORDER — WITCH HAZEL-GLYCERIN EX PADS: 1.0000 "application " | MEDICATED_PAD | CUTANEOUS | Status: DC | PRN

## 2020-06-03 MED ORDER — BUPIVACAINE HCL 0.5 % IJ SOLN
10.0000 mL | Freq: Once | INTRAMUSCULAR | Status: DC
  Filled 2020-06-03: qty 10

## 2020-06-03 MED ORDER — METHYLERGONOVINE MALEATE 0.2 MG/ML IJ SOLN
INTRAMUSCULAR | Status: AC
  Filled 2020-06-03: qty 1

## 2020-06-03 MED ORDER — ACETAMINOPHEN 500 MG PO TABS
1000.0000 mg | ORAL_TABLET | Freq: Four times a day (QID) | ORAL | Status: AC
  Administered 2020-06-03 – 2020-06-04 (×3): 1000 mg via ORAL
  Filled 2020-06-03 (×5): qty 2

## 2020-06-03 MED ORDER — LIDOCAINE HCL (PF) 2 % IJ SOLN
INTRAMUSCULAR | Status: DC | PRN
  Administered 2020-06-03 (×2): 100 mg via INTRADERMAL

## 2020-06-03 MED ORDER — ZOLPIDEM TARTRATE 5 MG PO TABS: 5.0000 mg | ORAL_TABLET | Freq: Every evening | ORAL | Status: DC | PRN

## 2020-06-03 MED ORDER — LIDOCAINE HCL (PF) 1 % IJ SOLN
INTRAMUSCULAR | Status: DC | PRN
  Administered 2020-06-03: 3 mL

## 2020-06-03 MED ORDER — PRENATAL MULTIVITAMIN CH
1.0000 | ORAL_TABLET | Freq: Every day | ORAL | Status: DC
  Administered 2020-06-04 – 2020-06-06 (×3): 1 via ORAL
  Filled 2020-06-03 (×3): qty 1

## 2020-06-03 MED ORDER — OXYCODONE HCL 5 MG PO TABS: 5.0000 mg | ORAL_TABLET | ORAL | Status: DC | PRN

## 2020-06-03 MED ORDER — COCONUT OIL OIL
1.0000 "application " | TOPICAL_OIL | Status: DC | PRN
  Filled 2020-06-03: qty 120

## 2020-06-03 MED ORDER — LACTATED RINGERS IV BOLUS: 1000.0000 mL | Freq: Once | INTRAVENOUS | Status: DC

## 2020-06-03 MED ORDER — OXYCODONE-ACETAMINOPHEN 5-325 MG PO TABS
1.0000 | ORAL_TABLET | ORAL | Status: DC | PRN
  Administered 2020-06-06: 1 via ORAL
  Filled 2020-06-03: qty 1

## 2020-06-03 MED ORDER — GENTAMICIN SULFATE 40 MG/ML IJ SOLN
500.0000 mg | INTRAVENOUS | Status: DC
  Filled 2020-06-03: qty 12.5

## 2020-06-03 MED ORDER — SCOPOLAMINE 1 MG/3DAYS TD PT72: 1.0000 | MEDICATED_PATCH | Freq: Once | TRANSDERMAL | Status: DC

## 2020-06-03 MED ORDER — GENTAMICIN SULFATE 40 MG/ML IJ SOLN
225.0000 mg | Freq: Three times a day (TID) | INTRAVENOUS | Status: DC
  Filled 2020-06-03 (×2): qty 5.75

## 2020-06-03 MED ORDER — MORPHINE SULFATE (PF) 0.5 MG/ML IJ SOLN
INTRAMUSCULAR | Status: DC | PRN
Start: 2020-06-03 — End: 2020-06-03
  Administered 2020-06-03: 3 mg via EPIDURAL

## 2020-06-03 MED ORDER — SODIUM CHLORIDE 0.9% FLUSH: 3.0000 mL | INTRAVENOUS | Status: DC | PRN

## 2020-06-03 SURGICAL SUPPLY — 24 items
CANISTER SUCT 3000ML PPV (MISCELLANEOUS) ×2 IMPLANT
CATH KIT ON-Q SILVERSOAK 5IN (CATHETERS) ×4 IMPLANT
CHLORAPREP W/TINT 26 (MISCELLANEOUS) ×4 IMPLANT
COVER WAND RF STERILE (DRAPES) ×2 IMPLANT
DERMABOND ADVANCED (GAUZE/BANDAGES/DRESSINGS) ×1
DERMABOND ADVANCED .7 DNX12 (GAUZE/BANDAGES/DRESSINGS) ×1 IMPLANT
ELECT CAUTERY BLADE 6.4 (BLADE) ×2 IMPLANT
ELECT REM PT RETURN 9FT ADLT (ELECTROSURGICAL) ×2
ELECTRODE REM PT RTRN 9FT ADLT (ELECTROSURGICAL) ×1 IMPLANT
GLOVE SKINSENSE NS SZ8.0 LF (GLOVE) ×1
GLOVE SKINSENSE STRL SZ8.0 LF (GLOVE) ×1 IMPLANT
GOWN STRL REUS W/ TWL LRG LVL3 (GOWN DISPOSABLE) ×1 IMPLANT
GOWN STRL REUS W/ TWL XL LVL3 (GOWN DISPOSABLE) ×2 IMPLANT
GOWN STRL REUS W/TWL LRG LVL3 (GOWN DISPOSABLE) ×1
GOWN STRL REUS W/TWL XL LVL3 (GOWN DISPOSABLE) ×2
NS IRRIG 1000ML POUR BTL (IV SOLUTION) ×2 IMPLANT
PACK C SECTION AR (MISCELLANEOUS) ×2 IMPLANT
PAD OB MATERNITY 4.3X12.25 (PERSONAL CARE ITEMS) ×2 IMPLANT
PAD PREP 24X41 OB/GYN DISP (PERSONAL CARE ITEMS) ×2 IMPLANT
PENCIL SMOKE ULTRAEVAC 22 CON (MISCELLANEOUS) ×2 IMPLANT
SUT MAXON ABS #0 GS21 30IN (SUTURE) ×4 IMPLANT
SUT VIC AB 1 CT1 36 (SUTURE) ×8 IMPLANT
SUT VIC AB 2-0 CT1 36 (SUTURE) ×2 IMPLANT
SUT VIC AB 4-0 FS2 27 (SUTURE) ×2 IMPLANT

## 2020-06-03 NOTE — Progress Notes (Signed)
Kelly Stevens is a 29 y.o. G4P0030 at [redacted]w[redacted]d by ultrasound admitted for rupture of membranes  Subjective: she is very comfortable with her epidural. She verbalizes understanding that with this cervical check, if there is no change, that a discussion regarding operative delivery witll take place. Expressing sadness that her labor desires are changing.   Objective: BP 131/65   Pulse 91   Temp 98.9 F (37.2 C) (Oral)   Resp 16   Ht 5\' 6"  (1.676 m)   Wt (!) 169.7 kg   LMP 09/10/2019 (Exact Date)   SpO2 100%   BMI 60.40 kg/m  No intake/output data recorded. No intake/output data recorded.  FHT:  FHR: 155 baseline bpm, variability: minimal ,  accelerations:  Abscent,  decelerations:  Absent UC:   regular, every 2.5-3  minutes SVE:   Dilation: 5 Effacement (%): 80 Station: -2 Exam by:: [email or web address]: Lab Results  Component Value Date   WBC 7.3 06/01/2020   HGB 11.4 (L) 06/01/2020   HCT 36.5 06/01/2020   MCV 72.9 (L) 06/01/2020   PLT 222 06/01/2020    Assessment / Plan: Arrest in active phase of labor  Labor: has had no cervical change in 2 hours with pitocin augment.  Fetal Wellbeing:  Category II Pain Control:  Epidural I/D:  n/a Anticipated MOD:  inlight of no cervical change over the course of last two hours, and with rising baseline, the initiation of antibiotics for maternal fever and prolongued ROM, a plsn for CS delivery is discussed. Dr. 08/01/2020 has been notified, and she will be consiented for surgery.  Dr. Tiburcio Pea is also aware and available.  Dr. Jerene Pitch on his way to Labor and Delivery.   Tiburcio Pea, CNM  06/03/2020 9:19 AM    08/03/2020 Kelly Stevens 06/03/2020, 9:07 AM

## 2020-06-03 NOTE — Progress Notes (Signed)
  Labor Progress Note   29 y.o. O1V6153 @ [redacted]w[redacted]d , admitted for  Pregnancy, Labor Management.   Subjective:  Some crampy and pains Epidural in place, has helped  Pitocin for ctxs, IUPC in place Endotool for mgt GDM Procardia for cHTN, no s/sx preeclampsia ROM prolonged, on ABX for this and for fever  Objective:  BP 131/65   Pulse 91   Temp 98.9 F (37.2 C) (Oral)   Resp 16   Ht 5\' 6"  (1.676 m)   Wt (!) 169.7 kg   LMP 09/10/2019 (Exact Date)   SpO2 100%   BMI 60.40 kg/m  Abd: gravid, ND, FHT present, mild tenderness on exam Extr: trace to 1+ bilateral pedal edema EFM: FHR: 140 bpm, variability: moderate,  accelerations:  Present,  decelerations:  Absent Toco: Frequency: Every 3-5 minutes Labs: I have reviewed the patient's lab results.   Assessment & Plan:  G4P0030 @ [redacted]w[redacted]d, admitted for  Pregnancy and Labor/Delivery Management  1. Pain management: epidural. 2. FWB: FHT category 1.  3. ID: GBS negative 4. Labor management: Monitor for active labor, need to see cervical change and progress, is on Pitocin 30 mU/min at this time.  Discussed w pt and sig other labor, protracted labor, cesarean, risks of obesity and diabetes and HTN.  Plan recheck of cervix soon.    All discussed with patient, see orders  [redacted]w[redacted]d, MD, Annamarie Major Ob/Gyn, [facility] 06/03/2020  8:01 AM

## 2020-06-03 NOTE — Progress Notes (Signed)
  Labor Progress Note   29 y.o. year old G4P0030 at [redacted]w[redacted]d weeks gestation admitted for Prelabor rupture of membranes  Subjective:  She is comfortable after the epidural placement. IUPC has fallen out and nurse requests that it be replaced. Patient's temperature is also 100.5  Objective:   Vitals:   06/03/20 0501 06/03/20 0535  BP: (!) 101/41   Pulse: (!) 107   Resp:    Temp:  (!) 100.5 F (38.1 C)  SpO2:      Abd: non-tender Extr: no edema  EFM: FHR: baseline 150 bpm, variability minimal to moderate, decelerations absent Toco: every 2 minutes, contractions adequate.  SVE: 5/80/-2 IUP replaced without issues  Assessment & Plan:  29 y.o. year old G4P0030 at [redacted]w[redacted]d weeks gestation admitted for prelabor rupture of membranes Pregnancy and Labor/Delivery Management  1. Pain management:  2. FWB: FHT category 2 3. ID: GBS Negative 4. Labor management: continue with induction with pitocin. Patient has made cervical change.  5. Chorioamnionitis and prolonged rupture of membranes, will start IV antibiotics.    All discussed with patient, see orders  Adelene Idler MD [facility], [facility] 06/03/2020 5:38 AM

## 2020-06-03 NOTE — Lactation Note (Signed)
This note was copied from a baby's chart. Lactation Consultation Note  Patient Name: Kelly Stevens [id number] Date: 06/03/2020 Reason for consult: Follow-up assessment;1st time breastfeeding;Early term 37-38.6wks (c/s, obesity, GDM)  Lactation follow-up. Second BS (pre-feed) was 36. LC assisted with support pillows, offered different positions, and assisted with feeding. Baby latched with a few attempts onto the left breast in football hold. LC remained at bedside with breast massage and compression, and support of baby at the breast for deep latch. Audible swallows heard spontaneously throughout entire feeding, baby unlatched on her own and was moved skin to skin on mom's chest. LC provided audible direction to parents post feed in achieving successful position and latch at next feeding. Talked with parents about BS next pre-feed, within the next 2-3 hrs, and steps of offering EBM via hand expression/pump, donor breastmilk or formula as supplement to improve sugars if needed including alternate feeding methods- finger/syringe, spoon. Parents verbalize understanding.  Maternal Data Formula Feeding for Exclusion: No Has patient been taught Hand Expression?: Yes Does the patient have breastfeeding experience prior to this delivery?: No  Feeding Feeding Type: Breast Fed  LATCH Score Latch: Repeated attempts needed to sustain latch, nipple held in mouth throughout feeding, stimulation needed to elicit sucking reflex.  Audible Swallowing: Spontaneous and intermittent  Type of Nipple: Everted at rest and after stimulation  Comfort (Breast/Nipple): Soft / non-tender  Hold (Positioning): Assistance needed to correctly position infant at breast and maintain latch.  LATCH Score: 8  Interventions Interventions: Breast feeding basics reviewed;Assisted with latch;Hand express;Breast massage;Breast compression;Adjust position;Support pillows;Position options  Lactation Tools Discussed/Used      Consult Status Consult Status: Follow-up Date: 06/04/20 Follow-up type: In-patient    Danford Bad 06/03/2020, 4:29 PM

## 2020-06-03 NOTE — Progress Notes (Signed)
History and Physical Interval Note:  06/03/2020 9:26 AM  Burley Saver is not progressing in labor.  The various methods of treatment have been discussed with the patient and family. After consideration of risks, benefits and other options for treatment, the patient has consented to  CESAREAN SECTION as a surgical intervention .  The patient's history has been reviewed, patient examined, no change in status, stable for surgery.    I have reviewed the patient's chart and labs.  Questions were answered to the patient's satisfaction.    The risks of cesarean section discussed with the patient included but were not limited to: bleeding which may require transfusion or reoperation; infection which may require antibiotics; injury to bowel, bladder, ureters or other surrounding organs; injury to the fetus; need for additional procedures including hysterectomy in the event of a life-threatening hemorrhage; placental abnormalities wth subsequent pregnancies, incisional problems, thromboembolic phenomenon and other postoperative/anesthesia complications. The patient concurred with the proposed plan, giving informed written consent for the procedure.   Annamarie Major, MD, Merlinda Frederick Ob/Gyn, [facility] 06/03/2020  9:26 AM

## 2020-06-03 NOTE — Progress Notes (Signed)
Pharmacy Antibiotic Note  Kelly Stevens is a 29 y.o. female admitted on 06/01/2020 with chorioamnionitis Pharmacy has been consulted for Gentamicin dosing.  Pt is pregnant so will use traditional daily dosing.   Plan: AdjBW =103.5 kg   Gentamicin 250 mg (2.5 mg/kg) IV X 1 loading dose ordered for 10/6 @ ~ 0700. Gentamicin 225 mg IV Q8H ordered to start on 10/6 @ 1500.   Goal peak:  3 - 4  Goal trough :  < 1   Will draw trough 30 min before 3rd dose on 10/6 @ 2230. Will draw peak 1  Hr after 3 rd dose on 10/7 @ 0030.    Height: 5\' 6"  (167.6 cm) Weight: (!) 169.7 kg (374 lb 3.2 oz) IBW/kg (Calculated) : 59.3  Temp (24hrs), Avg:98.7 F (37.1 C), Min:98.1 F (36.7 C), Max:100.5 F (38.1 C)  Recent Labs  Lab 06/01/20 1752 06/02/20 0553 06/03/20 0337  WBC 7.3  --   --   CREATININE 0.81 1.04* 1.17*    Estimated Creatinine Clearance: 114.9 mL/min (A) (by C-G formula based on SCr of 1.17 mg/dL (H)).    Allergies  Allergen Reactions  . Nickel Rash    Antimicrobials this admission:   >>    >>   Dose adjustments this admission:   Microbiology results:  BCx:   UCx:    Sputum:    MRSA PCR:   Thank you for allowing pharmacy to be a part of this patient's care.  Corine Solorio D 06/03/2020 6:04 AM

## 2020-06-03 NOTE — Op Note (Signed)
Cesarean Section Procedure Note Indications: chorioamnionitis, cephalo-pelvic disproportion, failure to progress: arrest of dilation and term intrauterine pregnancy  Pre-operative Diagnosis:  chorioamnionitis, cephalo-pelvic disproportion, failure to progress: arrest of dilation and term intrauterine pregnancy, [redacted] weeks EGA  Post-operative Diagnosis: same, delivered. Procedure: Low Transverse Cesarean Section Surgeon: Annamarie Major, MD, FACOG Assistant(s): Dr Jerene Pitch, No other capable assistant available, in surgery requiring high level assistant. Anesthesia: Epidural anesthesia Estimated Blood Loss:900 mL Complications: None; patient tolerated the procedure well. Disposition: PACU - hemodynamically stable. Condition: stable  Findings: A female infant in the cephalic OP presentation. "Raven" Amniotic fluid - Clear  Birth weight 8-5 lbs.  Apgars of 1, 6, and 10.  Intact placenta with a three-vessel cord. Grossly normal uterus, tubes and ovaries bilaterally. No intraabdominal adhesions were noted.  Procedure Details   The patient was taken to Operating Room, identified as the correct patient and the procedure verified as C-Section Delivery. A Time Out was held and the above information confirmed. After induction of anesthesia, the patient was draped and prepped in the usual sterile manner. A Pfannenstiel incision was made and carried down through the subcutaneous tissue to the fascia. Fascial incision was made and extended transversely with the Mayo scissors. The fascia was separated from the underlying rectus tissue superiorly and inferiorly. The peritoneum was identified and entered bluntly. Peritoneal incision was extended longitudinally. The utero-vesical peritoneal reflection was incised transversely and a bladder flap was created digitally.  A low transverse hysterotomy was made. The fetus was delivered atraumatically. The umbilical cord was clamped x2 and cut and the infant was handed to  the awaiting pediatricians. The placenta was removed intact and appeared normal with a 3-vessel cord.  The uterus was exteriorized and cleared of all clot and debris. The hysterotomy was closed with running sutures of 0 Vicryl suture. A second imbricating layer was placed with the same suture. Excellent hemostasis was observed. The uterus was returned to the abdomen. The pelvis was irrigated and again, excellent hemostasis was noted.  The On Q Pain pump System was then placed.  Trocars were placed through the abdominal wall into the subfascial space and these were used to thread the silver soaker cathaters into place.The rectus fascia was then reapproximated with running sutures of Maxon, with careful placement not to incorporate the cathaters. Subcutaneous tissues are then irrigated with saline and hemostasis assured.  Skin is then closed with 4-0 vicryl suture in a subcuticular fashion followed by skin adhesive. The cathaters are flushed each with 5 mL of Bupivicaine and stabilized into place with dressing. Instrument, sponge, and needle counts were correct prior to the abdominal closure and at the conclusion of the case. The surgical assistant performed tissue retraction, assistance with suturing, and fundal pressure.   The patient tolerated the procedure well and was transferred to the recovery room in stable condition.   Annamarie Major, MD, [facility] Frederick Ob/Gyn, [facility] 06/03/2020  11:04 AM

## 2020-06-03 NOTE — Anesthesia Preprocedure Evaluation (Signed)
Anesthesia Evaluation  Patient identified by MRN, date of birth, ID band Patient awake    Reviewed: Allergy & Precautions, NPO status , Patient's Chart, lab work & pertinent test results  History of Anesthesia Complications Negative for: history of anesthetic complications  Airway Mallampati: III  TM Distance: >3 FB Neck ROM: Full   Comment: Large neck Dental no notable dental hx. (+) Teeth Intact   Pulmonary neg pulmonary ROS, neg sleep apnea, neg COPD, Patient abstained from smoking.Not current smoker, former smoker,    Pulmonary exam normal breath sounds clear to auscultation       Cardiovascular Exercise Tolerance: Good METShypertension, Pt. on medications (-) CAD and (-) Past MI (-) dysrhythmias  Rhythm:Regular Rate:Normal - Systolic murmurs    Neuro/Psych  Headaches, negative psych ROS   GI/Hepatic neg GERD  ,(+)     (-) substance abuse  ,   Endo/Other  diabetes, GestationalMorbid obesity  Renal/GU negative Renal ROS     Musculoskeletal   Abdominal (+) + obese,   Peds  Hematology   Anesthesia Other Findings Past Medical History: No date: BMI 50.0-59.9, adult (HCC) No date: Gestational diabetes No date: Habitual aborter No date: Headache  Reproductive/Obstetrics (+) Pregnancy                             Anesthesia Physical Anesthesia Plan  ASA: III  Anesthesia Plan: Epidural   Post-op Pain Management:    Induction:   PONV Risk Score and Plan: 3 and Treatment may vary due to age or medical condition and Ondansetron  Airway Management Planned: Natural Airway  Additional Equipment:   Intra-op Plan:   Post-operative Plan:   Informed Consent: I have reviewed the patients History and Physical, chart, labs and discussed the procedure including the risks, benefits and alternatives for the proposed anesthesia with the patient or authorized representative who has indicated  his/her understanding and acceptance.       Plan Discussed with: Surgeon  Anesthesia Plan Comments: (Discussed R/B/A of neuraxial anesthesia technique with patient: - rare risks of spinal/epidural hematoma, nerve damage, infection - Risk of PDPH - Risk of itching - Risk of nausea and vomiting - Risk of poor block necessitating replacement of epidural.  - Risk of GETA if necessary for c/s.  Patient informed about increased incidence of above perioperative risk due to high BMI. Patient understands.  )        Anesthesia Quick Evaluation

## 2020-06-03 NOTE — Progress Notes (Addendum)
Pharmacy Antibiotic Note  Kelly Stevens is a 29 y.o. female admitted on 06/01/2020 with chorioamnionitis Pharmacy has been consulted for Gentamicin dosing.    - will change from traditional dosing to accepted 5mg /kg dosing regimen in pregnancy  Plan:  Patient initially ordered gentamicin traditional q8h dosing.  Will change to gentamicin 5mg /kg q24h per pre-pregnancy weight max 500mg .  Start gentamicin 500mg  IV q24h about 8h from 250mg  dose.    Monitor renal function  Per notes, not going for C-section.  Follow-up duration of antibiotics post delivery and need for gentamicin levels.    Height: 5\' 6"  (167.6 cm) Weight: (!) 169.7 kg (374 lb 3.2 oz) IBW/kg (Calculated) : 59.3  Temp (24hrs), Avg:98.8 F (37.1 C), Min:98.1 F (36.7 C), Max:100.5 F (38.1 C)  Recent Labs  Lab 06/01/20 1752 06/02/20 0553 06/03/20 0337  WBC 7.3  --   --   CREATININE 0.81 1.04* 1.17*    Estimated Creatinine Clearance: 114.9 mL/min (A) (by C-G formula based on SCr of 1.17 mg/dL (H)).    Allergies  Allergen Reactions  . Nickel Rash    Thank you for allowing pharmacy to be a part of this patient's care.  , PharmD, BCPS.   Work Cell: [phone] 06/03/2020 9:43 AM

## 2020-06-03 NOTE — Discharge Summary (Signed)
Postpartum Discharge Summary     Patient Name: Kelly Stevens DOB: 13-Jul-1991 MRN: [id number]  Date of admission: 06/01/2020 Delivery date:06/03/2020  Delivering provider: Gae Dry  Date of discharge: 06/06/2020  Admitting diagnosis: Labor and delivery, indication for care [O75.9] Intrauterine pregnancy: [redacted]w[redacted]d     Secondary diagnosis:  Active Problems:   Obesity in pregnancy, antepartum   Chronic hypertension affecting pregnancy   Gestational diabetes mellitus (GDM) in third trimester   Labor and delivery, indication for care   Failure to progress in labor   Acute kidney injury [facility])  Additional problems: Failure to progress, CPD, OP presentation    Discharge diagnosis: Term Pregnancy Delivered, Gestational Hypertension, GDM A2 and Obesity, Acute kidney injury                                              Post partum procedures:renal ultrasound Augmentation: Pitocin Complications: None  Hospital course: Onset of Labor With Unplanned C/S   29 y.o. yo [id number] at [redacted]w[redacted]d was admitted in Latent Labor and with PPROM on 06/01/2020. Patient had a labor course significant for arrest of dilation at 5 cm. The patient went for cesarean section due to Arrest of Dilation and CPD. Delivery details as follows: Membrane Rupture Time/Date: 1:31 AM ,06/03/2020   Delivery Method:C-Section, Low Transverse  Details of operation can be found in separate operative note. During the postpartum period the patient was noted to have an increasing creatinine level.  With hydration this improved a little. On POD#3 she had a slight increase, again this is thought to be due to a low volume status.  Internal medicine was consulted and recommended a renal ultrasound, which was normal. At the time of discharge she has a urine culture pending. MFM (Dr. Tama High) was consulted and recommended to monitor as an outpatient with the patient measuring her own urine output with labs to be performed at her follow up  visit a few days after discharge. She was instructed to call if her urine output dropped to below 30 mL/hour.  She did not have any other signs or symptoms of superimposed preeclampsia to warrant the use of magnesium sulfate.  Her blood pressures were largely controlled with her blood pressure medication.  She is ambulating,tolerating a regular diet, passing flatus, and urinating well.  Patient is discharged home in stable condition 06/06/20.  Newborn Data: Birth date:06/03/2020  Birth time:10:12 AM  Gender:Female  Living status:Living  Apgars:1 ,6  Weight:3760 g   Magnesium Sulfate received: No BMZ received: No Rhophylac:N/A MMR:Yes T-DaP:Given prenatally Transfusion:No  Physical exam  Vitals:   06/05/20 2333 06/06/20 0350 06/06/20 0905 06/06/20 1151  BP: (!) 151/85 131/83 (!) 155/91 (!) 145/94  Pulse: (!) 112 (!) 117 (!) 103 89  Resp: $Remo'20 16 18 20  'XiMRQ$ Temp: 99.5 F (37.5 C) 98.2 F (36.8 C) 98.6 F (37 C) 98.6 F (37 C)  TempSrc: Oral Oral Oral Oral  SpO2: 98% 100% 94% 97%  Weight:      Height:       General: alert, cooperative and no distress Lochia: appropriate Uterine Fundus: firm Incision: Provena wound vac in place. No fluid in cannister.   DVT Evaluation: No evidence of DVT seen on physical exam. No cords or calf tenderness. No significant calf/ankle edema. Labs: Lab Results  Component Value Date   WBC 9.3 06/06/2020  HGB 8.8 (L) 06/06/2020   HCT 28.9 (L) 06/06/2020   MCV 74.5 (L) 06/06/2020   PLT 209 06/06/2020   CMP Latest Ref Rng & Units 06/06/2020  Glucose 70 - 99 mg/dL 123(H)  BUN 6 - 20 mg/dL 12  Creatinine 0.44 - 1.00 mg/dL 1.29(H)  Sodium 135 - 145 mmol/L 138  Potassium 3.5 - 5.1 mmol/L 4.1  Chloride 98 - 111 mmol/L 105  CO2 22 - 32 mmol/L 24  Calcium 8.9 - 10.3 mg/dL 8.5(L)  Total Protein 6.5 - 8.1 g/dL 5.7(L)  Total Bilirubin 0.3 - 1.2 mg/dL 0.7  Alkaline Phos 38 - 126 U/L 139(H)  AST 15 - 41 U/L 32  ALT 0 - 44 U/L 15   Edinburgh  Score: Edinburgh Postnatal Depression Scale Screening Tool 06/04/2020  I have been able to laugh and see the funny side of things. 0  I have looked forward with enjoyment to things. 0  I have blamed myself unnecessarily when things went wrong. 2  I have been anxious or worried for no good reason. 2  I have felt scared or panicky for no good reason. 1  Things have been getting on top of me. 2  I have been so unhappy that I have had difficulty sleeping. 2  I have felt sad or miserable. 1  I have been so unhappy that I have been crying. 1  The thought of harming myself has occurred to me. 1  Edinburgh Postnatal Depression Scale Total 12      After visit meds:  Allergies as of 06/06/2020      Reactions   Nickel Rash      Medication List    STOP taking these medications   Accu-Chek Guide test strip Generic drug: glucose blood   Accu-Chek Softclix Lancets lancets   aspirin 81 MG chewable tablet   Blood Pressure Kit   insulin detemir 100 UNIT/ML injection Commonly known as: Levemir   promethazine 25 MG tablet Commonly known as: PHENERGAN   SYRINGE/NEEDLE (DISP) 1 ML 25G X 5/8" 1 ML Misc     TAKE these medications   acetaminophen 325 MG tablet Commonly known as: TYLENOL Take 2 tablets (650 mg total) by mouth every 4 (four) hours as needed for mild pain (temperature > 101.5.).   enoxaparin 80 MG/0.8ML injection Commonly known as: LOVENOX Inject 0.8 mLs (80 mg total) into the skin daily for 21 days.   NIFEdipine 60 MG 24 hr tablet Commonly known as: Procardia XL Take 1 tablet (60 mg total) by mouth daily.   oxyCODONE 5 MG immediate release tablet Commonly known as: Oxy IR/ROXICODONE Take 1 tablet (5 mg total) by mouth every 6 (six) hours as needed for moderate pain or severe pain.   prenatal multivitamin Tabs tablet Take 1 tablet by mouth daily at 12 noon.            Discharge Care Instructions  (From admission, onward)         Start     Ordered    06/05/20 0000  Discharge wound care:       Comments: SHOWER DAILY Wash incision gently with soap and water.  Call office with any drainage, redness, or firmness of the incision.   06/05/20 1852           Discharge home in stable condition Infant Feeding: Breast Infant Disposition:home with mother Discharge instruction: per After Visit Summary and Postpartum booklet. Activity: Advance as tolerated. Pelvic rest for 6 weeks.  Diet:  routine diet Anticipated Birth Control: Unsure Postpartum Appointment:2-3 days for incision check, labs (CBC and CMP)  Future Appointments:No future appointments. Follow up Visit:  Follow-up Information    Gae Dry, MD. Schedule an appointment as soon as possible for a visit on 06/16/2020.   Specialty: Obstetrics and Gynecology Why: Inc chck and Scurry removal, labs (CBC/CMP) Contact information: [place] [address] [phone]               Prentice Docker, MD, [place] 06/06/2020 2:11 PM

## 2020-06-03 NOTE — Anesthesia Procedure Notes (Signed)
Epidural Patient location during procedure: OB Start time: 06/03/2020 2:55 AM End time: 06/03/2020 3:22 AM  Staffing Anesthesiologist: Corinda Gubler, MD Performed: anesthesiologist   Preanesthetic Checklist Completed: patient identified, IV checked, site marked, risks and benefits discussed, surgical consent, monitors and equipment checked, pre-op evaluation and timeout performed  Epidural Patient position: sitting Prep: ChloraPrep Patient monitoring: heart rate, continuous pulse ox and blood pressure Approach: midline Location: L3-L4 (no landmarks palpated for accurate level assessment) Injection technique: LOR saline  Needle:  Needle type: Tuohy  Needle gauge: 17 G Needle length: 15 cm Needle insertion depth: 10 cm Catheter type: closed end flexible Catheter size: 19 Gauge Catheter at skin depth: 15 cm Test dose: negative and 1.5% lidocaine with Epi 1:200 K  Assessment Sensory level: T10 Events: blood not aspirated, injection not painful, no injection resistance, no paresthesia and negative IV test  Additional Notes first attempt Pt. Evaluated and documentation done after procedure finished. Patient identified. Risks/Benefits/Options discussed with patient including but not limited to bleeding, infection, nerve damage, paralysis, failed block, incomplete pain control, headache, blood pressure changes, nausea, vomiting, reactions to medication both or allergic, itching and postpartum back pain. Confirmed with bedside nurse the patient's most recent platelet count. Confirmed with patient that they are not currently taking any anticoagulation, have any bleeding history or any family history of bleeding disorders. Patient expressed understanding and wished to proceed. All questions were answered. Sterile technique was used throughout the entire procedure. Please see nursing notes for vital signs. Test dose was given through epidural catheter and negative prior to continuing to dose  epidural or start infusion. Warning signs of high block given to the patient including shortness of breath, tingling/numbness in hands, complete motor block, or any concerning symptoms with instructions to call for help. Patient was given instructions on fall risk and not to get out of bed. All questions and concerns addressed with instructions to call with any issues or inadequate analgesia.   Patient tolerated the insertion well without immediate complications.Reason for block:procedure for pain

## 2020-06-03 NOTE — Transfer of Care (Signed)
Immediate Anesthesia Transfer of Care Note  Patient: Kelly Stevens  Procedure(s) Performed: CESAREAN SECTION  Patient Location: Mother/Baby  Anesthesia Type:Epidural  Level of Consciousness: awake, alert  and oriented  Airway & Oxygen Therapy: Patient Spontanous Breathing  Post-op Assessment: Report given to RN and Post -op Vital signs reviewed and stable  Post vital signs: Reviewed and stable  Last Vitals:  Vitals Value Taken Time  BP 111/97 06/03/20 1115  Temp 38.8 C 06/03/20 1115  Pulse 109 06/03/20 1115  Resp 18 06/03/20 1115  SpO2 99 % 06/03/20 1115    Last Pain:  Vitals:   06/03/20 1117  TempSrc: (P) Axillary  PainSc:       Patients Stated Pain Goal: 0 (06/02/20 0748)  Complications: No complications documented.

## 2020-06-03 NOTE — Lactation Note (Signed)
This note was copied from a baby's chart. Lactation Consultation Note  Patient Name: Kelly Stevens [id number] Date: 06/03/2020 Reason for consult: Initial assessment;1st time breastfeeding;Early term 37-38.6wks;Other (Comment) (GDM, Obesity, C/S delivery)  Initial lactation visit with first time mom who delivered via c-section 4hrs ago. Mom was GDM and baby's first post BF BS was 42. Transition assisted with first position and latch, mom felt most comfortable in a modified cradle versus football hold due to her larger breasts and unable to see baby. Mom reports feeling tugging and no discomfort, baby feeding for up to 45 minutes.  LC reviewed newborn stomach size, feeding patterns and behaviors, reviewed early hunger cues, tips for achieving deep latch and optimal alignment, output expectations, and support available. Mom plans to implement pumping at some point for bottle feedings; no formula at this time- mom reports her admission for breast and bottle meant breast and EBM via bottle. Lactation name/number updated on whiteboard; encouraged parents to call with questions and for support.  Maternal Data Formula Feeding for Exclusion: No Has patient been taught Hand Expression?: Yes Does the patient have breastfeeding experience prior to this delivery?: No  Feeding Feeding Type: Breast Fed  LATCH Score Latch: Repeated attempts needed to sustain latch, nipple held in mouth throughout feeding, stimulation needed to elicit sucking reflex.  Audible Swallowing: Spontaneous and intermittent  Type of Nipple: Everted at rest and after stimulation  Comfort (Breast/Nipple): Soft / non-tender  Hold (Positioning): Assistance needed to correctly position infant at breast and maintain latch.  LATCH Score: 8  Interventions Interventions: Breast feeding basics reviewed  Lactation Tools Discussed/Used     Consult Status Consult Status: Follow-up Date: 06/03/20 Follow-up type:  In-patient    Danford Bad 06/03/2020, 3:05 PM

## 2020-06-04 ENCOUNTER — Ambulatory Visit: Payer: Medicaid Other

## 2020-06-04 ENCOUNTER — Other Ambulatory Visit: Payer: Medicaid Other

## 2020-06-04 LAB — CBC
HCT: 26.9 % — ABNORMAL LOW (ref 36.0–46.0)
Hemoglobin: 8.9 g/dL — ABNORMAL LOW (ref 12.0–15.0)
MCH: 23.4 pg — ABNORMAL LOW (ref 26.0–34.0)
MCHC: 33.1 g/dL (ref 30.0–36.0)
MCV: 70.8 fL — ABNORMAL LOW (ref 80.0–100.0)
Platelets: 154 10*3/uL (ref 150–400)
RBC: 3.8 MIL/uL — ABNORMAL LOW (ref 3.87–5.11)
RDW: 15.1 % (ref 11.5–15.5)
WBC: 13.5 10*3/uL — ABNORMAL HIGH (ref 4.0–10.5)
nRBC: 0 % (ref 0.0–0.2)

## 2020-06-04 LAB — CREATININE, SERUM
Creatinine, Ser: 1.25 mg/dL — ABNORMAL HIGH (ref 0.44–1.00)
GFR calc non Af Amer: 58 mL/min — ABNORMAL LOW (ref >60)

## 2020-06-04 LAB — GLUCOSE, CAPILLARY: Glucose-Capillary: 80 mg/dL (ref 70–99)

## 2020-06-04 NOTE — Anesthesia Post-op Follow-up Note (Signed)
  Anesthesia Pain Follow-up Note  Patient: Kelly Stevens  Day #: 1  Date of Follow-up: 06/04/2020 Time: 8:00 AM  Last Vitals:  Vitals:   06/04/20 0323 06/04/20 0500  BP: 107/64   Pulse: 80 88  Resp:    Temp: 36.6 C   SpO2: 98% 96%    Level of Consciousness: alert  Pain: none   Side Effects:None  Catheter Site Exam:clean  Anti-Coag Meds (From admission, onward)   Start     Dose/Rate Route Frequency Ordered Stop   06/04/20 0800  enoxaparin (LOVENOX) injection 40 mg        40 mg Subcutaneous Every 24 hours 06/03/20 1437         Plan: D/C from anesthesia care at surgeon's request  Jules Schick

## 2020-06-04 NOTE — Lactation Note (Signed)
This note was copied from a baby's chart. Lactation Consultation Note  Patient Name: Girl Mandy Peeks [id number] Date: 06/04/2020 Reason for consult: Follow-up assessment;Mother's request;1st time breastfeeding;Early term 37-38.6wks;Other (Comment) (c/s, GDM)  Lactation follow-up. DBM used overnight to help stabilize BS levels 2x. Mom having difficulty getting baby to latch to her left breast; more confident on the right. RN overnight suggested pumping left breast for equal stimulation. Parents considering pumping and bottle feeding to help minimize the amount of crying or missing of late hunger cues, and had questions re: pacifier use. LC educated parents on early feeding cues, learning differences in cries, alternate soothing techniques, and how pacifiers can inhibit seeing/learning early cues. Parents verbalize understanding. Parents desire to continue working on feedings, set-up pump education, and learn bottle feeding. LC to assist throughout the day.  Maternal Data Formula Feeding for Exclusion: No Has patient been taught Hand Expression?: Yes Does the patient have breastfeeding experience prior to this delivery?: No  Feeding    LATCH Score                   Interventions Interventions: Breast feeding basics reviewed;DEBP  Lactation Tools Discussed/Used     Consult Status Consult Status: Follow-up Date: 06/04/20 Follow-up type: In-patient    Danford Bad 06/04/2020, 10:01 AM

## 2020-06-04 NOTE — Progress Notes (Signed)
Admit Date: 06/01/2020 Today's Date: 06/04/2020  Subjective: Postpartum Day 1: Cesarean Delivery Patient reports tolerating PO and no problems voiding.    Objective: Vital signs in last 24 hours: Temp:  [97.9 F (36.6 C)-103 F (39.4 C)] 98.3 F (36.8 C) (10/07 0805) Pulse Rate:  [80-130] 91 (10/07 0805) Resp:  [14-39] 20 (10/07 0805) BP: (107-159)/(62-110) 113/72 (10/07 0805) SpO2:  [83 %-100 %] 97 % (10/07 0805)  Physical Exam:  General: alert, cooperative and no distress Lochia: appropriate Uterine Fundus: firm Incision: healing well, no significant drainage, no dehiscence, no significant erythema DVT Evaluation: No evidence of DVT seen on physical exam. Negative Homan's sign. No cords or calf tenderness. No significant calf/ankle edema.  Recent Labs    06/01/20 1752 06/04/20 0353  HGB 11.4* 8.9*  HCT 36.5 26.9*  POCT BS 80  Assessment/Plan: Status post Cesarean section. Doing well postoperatively.  Continue current care.  1. CHTN.  Cont Procardia. Well controlled at this time.  No s/sxc preeclampsia. 2. GDM.  BS 80 this am.  No further Insulin recommended.  Retest Glucola at 6 weeks post partum 3. S/p CS.  Wound care discussed.  OnQ for 4 days. Prevena for 7 days.  Post Op check in 7 days for its removal and inspection. 4. BMI.  Risks of DVT, so starting post partum Lovenox today for 6 weeks.  Discharge planning and instructions for use required.   5. Anemia.  No sx's.  Monitor for signs of worsening anemia.  Min lochia today, likely at equilibrium. 6. Advance diet and ambulation. 7. Discharge planning.  Pt prefers to be discharged if possible on POD #2. 8. Infant doing well.  Breast feeding. 9. TDaP UTD 10. MMR ordered 11.  Covid vaccination UTD 12. Consider flu shot prior to d/c 13. D/c ABX.  No sequele from chorioamnionitis noted at this time  Hoyt Koch 06/04/2020, 8:46 AM

## 2020-06-04 NOTE — Lactation Note (Signed)
This note was copied from a baby's chart. Lactation Consultation Note  Patient Name: Kelly Stevens [id number] Date: 06/04/2020 Reason for consult: Follow-up assessment;1st time breastfeeding;Early term 37-38.6wks;Other (Comment) (c/s, GDM)  Lactation assisted with feeding at 1215. Baby had not fed well from left breast since yesterday when [facility]. Baby was crying, and angry. LC assisted with calming baby down, adding support pillows and placement of baby at the breast in football hold. Once calm, baby latched easily, and began strong rhythmic sucking with audible swallows. Mid-way through feeding baby did pull off, was burped, and put back to breast. Baby unlatched on her own at , and laid quietly beside mom. LC assisted in hold baby at breast, and holding breast tissue along with breast massage and compression throughout feeding. LC reviewed early hunger cues and the benefit of getting baby to breast with early cues instead of late cues. Mom verbalized understanding. Encouraged to keep baby skin to skin, practice identifying early cues, and hand expression on opposite breast as needed.  Maternal Data Formula Feeding for Exclusion: No Has patient been taught Hand Expression?: Yes Does the patient have breastfeeding experience prior to this delivery?: No  Feeding Feeding Type: Breast Fed  LATCH Score Latch: Grasps breast easily, tongue down, lips flanged, rhythmical sucking.  Audible Swallowing: Spontaneous and intermittent  Type of Nipple: Everted at rest and after stimulation  Comfort (Breast/Nipple): Soft / non-tender  Hold (Positioning): Assistance needed to correctly position infant at breast and maintain latch.  LATCH Score: 9  Interventions Interventions: Breast feeding basics reviewed;Assisted with latch;Breast massage;Hand express;Breast compression;Adjust position;Support pillows  Lactation Tools Discussed/Used     Consult Status Consult Status:  Follow-up Date: 06/04/20 Follow-up type: In-patient    Danford Bad 06/04/2020, 12:59 PM

## 2020-06-04 NOTE — Anesthesia Postprocedure Evaluation (Signed)
Anesthesia Post Note  Patient: Kelly Stevens  Procedure(s) Performed: CESAREAN SECTION  Patient location during evaluation: Mother Baby Anesthesia Type: Epidural Level of consciousness: awake and alert Pain management: pain level controlled Vital Signs Assessment: post-procedure vital signs reviewed and stable Respiratory status: spontaneous breathing, nonlabored ventilation and respiratory function stable Cardiovascular status: stable Postop Assessment: no headache, no backache and epidural receding Anesthetic complications: no   No complications documented.   Last Vitals:  Vitals:   06/04/20 0323 06/04/20 0500  BP: 107/64   Pulse: 80 88  Resp:    Temp: 36.6 C   SpO2: 98% 96%    Last Pain:  Vitals:   06/04/20 0323  TempSrc: Oral  PainSc:                  Jules Schick

## 2020-06-04 NOTE — Lactation Note (Signed)
This note was copied from a baby's chart. Lactation Consultation Note  Patient Name: [name] [id number] Date: 06/04/2020 Reason for consult: Follow-up assessment;Mother's request;1st time breastfeeding;Early term 37-38.6wks;Other (Comment) (c/s, GDM)  Mom called for lactation assistance. Mom desires to set-up and learn how to use her DEBP. Mom continues to plan to breastfeed on right and pump left until she becomes more comfortable. She also desires to have EBM readily available for dad to assist with feedings. LC reviewed early pump initiation, expectations for output, and cautioned against early nipple introduction. Mom has her own Spectra 2 DEBP from insurance. Mom educated on set-up, use, cleaning, and milk storage and thawing. Mom pumped for the first time on the right breast for 15 minutes, 2-54mL expressed of colostrum. LC provided guidance on various methods of giving to baby when she is awake (sleeping at this time). Mom verbalizes her plan at next feeding is to feed on right and pump left.  RN team updated.  Maternal Data Formula Feeding for Exclusion: No Has patient been taught Hand Expression?: Yes Does the patient have breastfeeding experience prior to this delivery?: No  Feeding Feeding Type: Breast Fed  LATCH Score Latch: Grasps breast easily, tongue down, lips flanged, rhythmical sucking.  Audible Swallowing: Spontaneous and intermittent  Type of Nipple: Everted at rest and after stimulation  Comfort (Breast/Nipple): Soft / non-tender  Hold (Positioning): Assistance needed to correctly position infant at breast and maintain latch.  LATCH Score: 9  Interventions Interventions: Breast feeding basics reviewed;DEBP;Expressed milk  Lactation Tools Discussed/Used Tools: Pump Breast pump type: Double-Electric Breast Pump Pump Review: Setup, frequency, and cleaning;Milk Storage (Spectra 2) Initiated by:: Magnus Ivan, MPH, IBCLC Date initiated::  06/04/20   Consult Status Consult Status: Follow-up Date: 06/04/20 Follow-up type: Call as needed    Danford Bad 06/04/2020, 3:43 PM

## 2020-06-05 ENCOUNTER — Inpatient Hospital Stay: Payer: Medicaid Other

## 2020-06-05 LAB — CREATININE, SERUM
Creatinine, Ser: 1.18 mg/dL — ABNORMAL HIGH (ref 0.44–1.00)
GFR, Estimated: >60 mL/min (ref >60)

## 2020-06-05 LAB — URINALYSIS, COMPLETE (UACMP) WITH MICROSCOPIC
Bilirubin Urine: NEGATIVE
Glucose, UA: NEGATIVE mg/dL
Ketones, ur: NEGATIVE mg/dL
Nitrite: NEGATIVE
Protein, ur: NEGATIVE mg/dL
Specific Gravity, Urine: 1.002 — ABNORMAL LOW (ref 1.005–1.030)
pH: 6 (ref 5.0–8.0)

## 2020-06-05 LAB — SURGICAL PATHOLOGY

## 2020-06-05 MED ORDER — OXYCODONE HCL 5 MG PO TABS
5.0000 mg | ORAL_TABLET | Freq: Four times a day (QID) | ORAL | 0 refills | Status: DC | PRN
Start: 2020-06-05 — End: 2020-07-13

## 2020-06-05 MED ORDER — ENOXAPARIN SODIUM 40 MG/0.4ML ~~LOC~~ SOLN
40.0000 mg | Freq: Once | SUBCUTANEOUS | Status: AC
  Administered 2020-06-05: 40 mg via SUBCUTANEOUS
  Filled 2020-06-05: qty 0.4

## 2020-06-05 MED ORDER — ACETAMINOPHEN 325 MG PO TABS: 650.0000 mg | ORAL_TABLET | ORAL | 0 refills | Status: AC | PRN

## 2020-06-05 MED ORDER — ENOXAPARIN SODIUM 80 MG/0.8ML ~~LOC~~ SOLN
80.0000 mg | SUBCUTANEOUS | Status: DC
  Administered 2020-06-06: 80 mg via SUBCUTANEOUS
  Filled 2020-06-05 (×2): qty 0.8

## 2020-06-05 MED ORDER — ENOXAPARIN SODIUM 80 MG/0.8ML ~~LOC~~ SOLN: 80.0000 mg | SUBCUTANEOUS | 0 refills | Status: DC

## 2020-06-05 NOTE — Progress Notes (Signed)
Obstetric Postpartum/PostOperative Daily Progress Note Subjective:  29 y.o. [id number] post-operative day # 2 status post primary cesarean delivery.  She is ambulating, is tolerating po, is voiding spontaneously.  Her pain is well controlled on PO pain medications. Her lochia is less than menses. She reports breastfeeding is going well. She reports improvement in lower extremity swelling.   Patient has a strong desire to discharge to home today. I informed her of abnormal and worsening kidney function test results and the recommendations of Dr Jerene Pitch and hospitalist Dr Clyde Lundborg. The patient understands the recommendations and is not sure yet if she will stay until tomorrow or if she will sign out AMA today. I discussed PO hydration(IV was discontinued earlier) this morning and recheck of serum creatinine after hydrating. She agrees to stay for hydration, renal ultrasound, labs and then she will decide if she will stay until tomorrow.    Medications SCHEDULED MEDICATIONS  . enoxaparin (LOVENOX) injection  40 mg Subcutaneous Q24H  . measles, mumps & rubella vaccine  0.5 mL Subcutaneous Once  . NIFEdipine  60 mg Oral Daily  . prenatal multivitamin  1 tablet Oral Q1200  . scopolamine  1 patch Transdermal Once  . senna-docusate  2 tablet Oral Q24H  . simethicone  80 mg Oral TID PC  . simethicone  80 mg Oral Q24H    MEDICATION INFUSIONS  . bupivacaine 0.25 % ON-Q pump DUAL CATH 400 mL    . bupivacaine ON-Q pain pump    . lactated ringers Stopped (06/03/20 2227)  . naLOXone [facility]) adult infusion for PRURITIS      PRN MEDICATIONS  acetaminophen, coconut oil, witch hazel-glycerin **AND** dibucaine, diphenhydrAMINE, diphenhydrAMINE **OR** diphenhydrAMINE, menthol-cetylpyridinium, morphine injection, nalbuphine **OR** nalbuphine, nalbuphine **OR** nalbuphine, naloxone **AND** sodium chloride flush, naLOXone (NARCAN) adult infusion for PRURITIS, ondansetron (ZOFRAN) IV, oxyCODONE, oxyCODONE-acetaminophen,  simethicone, zolpidem    Objective:   Vitals:   06/04/20 1932 06/05/20 0018 06/05/20 0300 06/05/20 0734  BP: 129/62 126/72 128/79 130/76  Pulse: (!) 101 (!) 107 (!) 101 (!) 108  Resp:  20 20 18   Temp:  98.2 F (36.8 C) 97.8 F (36.6 C) 97.8 F (36.6 C)  TempSrc:  Oral Oral Oral  SpO2:  99% 100% 100%  Weight:      Height:        Current Vital Signs 24h Vital Sign Ranges  T 97.8 F (36.6 C) Temp  Avg: 98.2 F (36.8 C)  Min: 97.8 F (36.6 C)  Max: 98.6 F (37 C)  BP 130/76 BP  Min: 91/68  Max: 130/76  HR (!) 108 Pulse  Avg: 104.3  Min: 101  Max: 108  RR 18 Resp  Avg: 19.3  Min: 18  Max: 20  SaO2 100 %  (Room Air) SpO2  Avg: 98.8 %  Min: 97 %  Max: 100 %       24 Hour I/O Current Shift I/O  Time Ins Outs 10/07 0701 - 10/08 0700 In: -  Out: 450 [Urine:450] No intake/output data recorded.  General: NAD Pulmonary: no increased work of breathing Abdomen: non-distended, non-tender, fundus firm at level of umbilicus Inc: Clean/dry/intact Extremities: +2 edema, no erythema, no tenderness  Labs:  Recent Labs  Lab 06/01/20 1752 06/04/20 0353  WBC 7.3 13.5*  HGB 11.4* 8.9*  HCT 36.5 26.9*  PLT 222 154     Assessment:   29 y.o. 08/04/20 postoperative day # 2 status post primary cesarean section, lactating  Plan:  1) Acute blood loss anemia -  hemodynamically stable and asymptomatic - po ferrous sulfate  2) B POS / Rubella <0.90 (03/08 1054)/ Varicella Immune  3) TDAP status up to date  4) breast, has supplemented with formula/Contraception = not discussed   5) Acute kidney injury: check for UTI, renal u/s to check for blockage, gentle hydration, avoid renal toxic medications, recheck serum creatinine tomorrow morning  6) Disposition: continue current care   Tresea Mall, CNM 06/05/2020 1:09 PM

## 2020-06-05 NOTE — Clinical Social Work Maternal (Signed)
CLINICAL SOCIAL WORK MATERNAL/CHILD NOTE  Patient Details  Name: Kelly Stevens MRN: [id number] Date of Birth: 01/04/1991  Date:  06/05/2020  Clinical Social Worker Initiating Note:  Falesha Schommer Date/Time: Initiated:  06/05/20/      Child's Name:  Kelly Stevens   Biological Parents:  Mother, Father   Need for Interpreter:  None   Reason for Referral:   (Edib[place]h Score of 12)   Address:  [address] [place] [address]    Phone number:  [phone] (home)     Additional phone number: None  Household Members/Support Persons (HM/SP):   Household Member/Support Person 1   HM/SP Name Relationship DOB or Age  HM/SP -1 Kelly Stevens spouse unknown  HM/SP -2        HM/SP -3        HM/SP -4        HM/SP -5        HM/SP -6        HM/SP -7        HM/SP -8          Natural Supports (not living in the home):  Extended Family, Friends, Immediate Family   Professional Supports:     Employment: Unemployed   Type of Work:     Education:  Some College   Homebound arranged:    Financial Resources:  Medicaid   Other Resources:  WIC   Cultural/Religious Considerations Which May Impact Care:  None  Strengths:      Psychotropic Medications:         Pediatrician:       Pediatrician List:   [place]    [place]    [place]    [place]    [place]    [place]      Pediatrician Fax Number:    Risk Factors/Current Problems:  None   Cognitive State:  Able to Concentrate , Alert , Goal Oriented    Mood/Affect:  Calm , Happy    CSW Assessment:   CSW received a consult for MOB with Ediburgh Score of 12.  CSW spoke with RN Kelly Stevens prior to meeting with MOB. Per RN, there are no additional concerns at this time.   CSW met with MOB at bedside. Explained CSW's role and reason for referral.  MOB reported she is feeling "good, just ready to go home" post delivery. MOB was alert, appropriate, and attentive to Baby during  assessment.   Confirmed contact information from chart for MOB. MOB and Baby will be living with FOB at discharge.   MOB reported she receives WIC and will inform her Workers of Baby's birth. MOB plans to use [facility] for Baby's Medical Care. MOB reported she has a crib/bassinett, car seat (new), clothing, diapers, and all other items needed for Baby. MOB reported she has reliable transportation for herself and Baby. MOB denied resource needs at this time.   MOB reported she has a history of depression and anxiety, but nothing has been officially diagnosed. No medication or counseling history. MOB reported she has a good support system and is coping well emotionally at this time. MOB said emotionally she is doing "a lot better than I thought I would." MOB denied SI, HI, or DV. MOB denied the need for mental health support resources at this time, reported she is aware of resources if needed.  CSW provided education and is sending RN information sheets on PPD and SIDS to give to patient. MOB verbalized   understanding. CSW ecouraged MOB to reach out to her Provider with any questions or needs for support or resources, even after discharge.   MOB denied any needs or questions at this time. CSW encouraged MOB to reach out if any arise prior to discharge.   Please re consult CSW if any additional needs or concerns arise.  CSW Plan/Description:  Sudden Infant Death Syndrome (SIDS) Education, Perinatal Mood and Anxiety Disorder (PMADs) Education, No Further Intervention Required/No Barriers to Discharge, Other Patient/Family Education    Magnus Ivan, LCSW 2020-07-10, 12:26 PM

## 2020-06-05 NOTE — Progress Notes (Addendum)
Reviewed results of labs/imaging done today. Reiterated recommendation that patient stay overnight and have repeat serum creatinine done in the morning and in the meantime continue to PO hydrate. Patient is still expressing desire to go home. She would like to talk with Dr Jerene Pitch after clinic today to discuss her desire to discharge. She is also tired and is currently resting. I encouraged her to try and sleep for a little while. I notified Dr Jerene Pitch that patient wants to discuss plan of care with her.   Results for Kelly, Stevens (MRN [id number]) as of 06/05/2020 16:07  Ref. Range 06/05/2020 12:11 06/05/2020 13:20 06/05/2020 13:48  Creatinine Latest Ref Range: 0.44 - 1.00 mg/dL 4.65 (H)    GFR, Estimated Latest Ref Range: >60 mL/min >60    Appearance Latest Ref Range: CLEAR    HAZY (A)  Bilirubin Urine Latest Ref Range: NEGATIVE    NEGATIVE  Color, Urine Latest Ref Range: YELLOW    YELLOW (A)  Glucose, UA Latest Ref Range: NEGATIVE mg/dL   NEGATIVE  Hgb urine dipstick Latest Ref Range: NEGATIVE    LARGE (A)  Ketones, ur Latest Ref Range: NEGATIVE mg/dL   NEGATIVE  Leukocytes,Ua Latest Ref Range: NEGATIVE    MODERATE (A)  Nitrite Latest Ref Range: NEGATIVE    NEGATIVE  pH Latest Ref Range: 5.0 - 8.0    6.0  Protein Latest Ref Range: NEGATIVE mg/dL   NEGATIVE  Specific Gravity, Urine Latest Ref Range: 1.005 - 1.030    1.002 (L)  Bacteria, UA Latest Ref Range: NONE SEEN    RARE (A)  RBC / HPF Latest Ref Range: 0 - 5 RBC/hpf   0-5  Squamous Epithelial / LPF Latest Ref Range: 0 - 5    0-5  WBC, UA Latest Ref Range: 0 - 5 WBC/hpf   0-5  URINE CULTURE Unknown   Rpt  US RENAL Unknown  Rpt    CLINICAL DATA:  Acute kidney injury  EXAM: RENAL ULTRASOUND  COMPARISON:  None.  FINDINGS: Right Kidney:  Renal measurements: 11.3 x 5.1 x 5.7 cm = volume: 171.1 mL. Echogenicity and renal cortical thickness are within normal limits. No mass, perinephric fluid, or hydronephrosis visualized.  No sonographically demonstrable calculus or ureterectasis.  Left Kidney:  Renal measurements: 12.7 x 5.1 x 5.3 cm = volume: 177.9 mL. Echogenicity and renal cortical thickness are within normal limits. No mass, perinephric fluid, or hydronephrosis visualized. No sonographically demonstrable calculus or ureterectasis.  Bladder:  Overlying bandage precludes sonographic assessment of the bladder.  Other:  None.  IMPRESSION: Normal appearing kidneys bilaterally by ultrasound.  Electronically Signed   By: Bretta Bang III M.D.   On: 06/05/2020 13:47  BP 130/76 (BP Location: Left Arm)   Pulse (!) 108   Temp 97.8 F (36.6 C) (Oral)   Resp 18   Ht 5\' 6"  (1.676 m)   Wt (!) 169.7 kg   LMP 09/10/2019 (Exact Date)   SpO2 100%   Breastfeeding/formula feeding  BMI 60.40 kg/m    11/08/2019, CNM [facility] 06/05/2020, 4:11 PM

## 2020-06-05 NOTE — Care Management (Signed)
This is a no charge note  I was called by OB/GYN, Tresea Mall for advice about AKI.  Pt is 2 days s/p of C-section. Pt was found to have AKI, with creatinine up from 0.81 on 06/01/2020 to 1.25 today and BUN 58.  Patient seems not to be on renal toxic medications.  I recommended to check urinalysis to make sure patient does not have UTI.  Get renal ultrasound to rule out obstruction. Gently hydrate the patient with normal saline at 100 cc/h.  Avoid using renal toxic medications.  Recheck renal function tomorrow morning.   Lorretta Harp, MD  [facility]   If 7PM-7AM, please contact night-coverage [email or web address] 06/05/2020, 10:24 AM

## 2020-06-05 NOTE — Lactation Note (Signed)
This note was copied from a baby's chart. Lactation Consultation Note  Patient Name: Kelly Stevens [id number] Date: 06/05/2020 Reason for consult: Follow-up assessment;Primapara;Early term 37-38.6wks   Maternal Data Formula Feeding for Exclusion: Yes Reason for exclusion: Mother's choice to formula and breast feed on admission Does the patient have breastfeeding experience prior to this delivery?: No  Feeding  Mom has been feeding baby formula today, has offered breast once but states her nipples are too painful and that baby is hungry and needs more than she is producing, she states she plans on pumping her breasts but has not today, she pumped yesterday but was unable to see any production  LATCH Score                   Interventions  Recommended to mom that if she plans on giving baby any breast milk that she would need to stimulate breasts by pumping her breasts every 3 hrs if possible to increase milk production and prevent engorgement even if she does not see milk expressed at first. Informed her that colostrum is thick and can be expressed easier with hand expression.    She can also offer baby breast and give formula after if she feels baby is not getting enough at breast until her production increases.   She is wanting to be d/'d tonight.   Encouraged her to get  rest as she is exhausted and feeling overwhelmed.  Reassured that formula feeding is good option if it works best and reassures her that baby is getting enough to eat.  I gave her coconut oil for her sore nipples with instruction in use and instructed her how to use with pumping.      Lactation Tools Discussed/Used  I informed her that she could call for a lactation consult after discharge if she needs help with breastfeeding.      Consult Status Consult Status: Follow-up Date: 06/06/20 Follow-up type: In-patient    Kelly Stevens 06/05/2020, 7:23 PM

## 2020-06-06 DIAGNOSIS — N179 Acute kidney failure, unspecified: Secondary | ICD-10-CM

## 2020-06-06 LAB — COMPREHENSIVE METABOLIC PANEL
ALT: 15 U/L (ref 0–44)
AST: 32 U/L (ref 15–41)
Albumin: 2.3 g/dL — ABNORMAL LOW (ref 3.5–5.0)
Alkaline Phosphatase: 139 U/L — ABNORMAL HIGH (ref 38–126)
Anion gap: 9 (ref 5–15)
BUN: 12 mg/dL (ref 6–20)
CO2: 24 mmol/L (ref 22–32)
Calcium: 8.5 mg/dL — ABNORMAL LOW (ref 8.9–10.3)
Chloride: 105 mmol/L (ref 98–111)
Creatinine, Ser: 1.29 mg/dL — ABNORMAL HIGH (ref 0.44–1.00)
GFR, Estimated: 56 mL/min — ABNORMAL LOW (ref >60)
Glucose, Bld: 123 mg/dL — ABNORMAL HIGH (ref 70–99)
Potassium: 4.1 mmol/L (ref 3.5–5.1)
Sodium: 138 mmol/L (ref 135–145)
Total Bilirubin: 0.7 mg/dL (ref 0.3–1.2)
Total Protein: 5.7 g/dL — ABNORMAL LOW (ref 6.5–8.1)

## 2020-06-06 LAB — CBC
HCT: 28.9 % — ABNORMAL LOW (ref 36.0–46.0)
Hemoglobin: 8.8 g/dL — ABNORMAL LOW (ref 12.0–15.0)
MCH: 22.7 pg — ABNORMAL LOW (ref 26.0–34.0)
MCHC: 30.4 g/dL (ref 30.0–36.0)
MCV: 74.5 fL — ABNORMAL LOW (ref 80.0–100.0)
Platelets: 209 10*3/uL (ref 150–400)
RBC: 3.88 MIL/uL (ref 3.87–5.11)
RDW: 15.9 % — ABNORMAL HIGH (ref 11.5–15.5)
WBC: 9.3 10*3/uL (ref 4.0–10.5)
nRBC: 0 % (ref 0.0–0.2)

## 2020-06-06 LAB — PROTEIN / CREATININE RATIO, URINE
Creatinine, Urine: 45 mg/dL
Protein Creatinine Ratio: 0.62 mg/mg{Cre} — ABNORMAL HIGH (ref 0.00–0.15)
Total Protein, Urine: 28 mg/dL

## 2020-06-06 NOTE — Progress Notes (Signed)
Patient discharged home with infant. Discharge instructions, prescriptions, and follow up appointment given to and reviewed with patient. Patient verbalized understanding. Heavy discharge teaching done on postpartum hypertension, postpartum depression/anxiety, and wound infection prevention. Patient verbalized understanding. Patient wheeled out with infant by auxiliary.

## 2020-06-06 NOTE — Discharge Instructions (Signed)
Measure Urine output in hat with every time you go to the bathroom. You should average about 30 ml per hour (e.g. 300 ml every 10 hours). If you average less than 30 ml every hour, please call us immediately at [phone]   Cesarean Delivery, Care After This sheet gives you information about how to care for yourself after your procedure. Your health care provider may also give you more specific instructions. If you have problems or questions, contact your health care provider. What can I expect after the procedure? After the procedure, it is common to have:  A small amount of blood or clear fluid coming from the incision.  Some redness, swelling, and pain in your incision area.  Some abdominal pain and soreness.  Vaginal bleeding (lochia). Even though you did not have a vaginal delivery, you will still have vaginal bleeding and discharge.  Pelvic cramps.  Fatigue. You may have pain, swelling, and discomfort in the tissue between your vagina and your anus (perineum) if:  Your C-section was unplanned, and you were allowed to labor and push.  An incision was made in the area (episiotomy) or the tissue tore during attempted vaginal delivery. Follow these instructions at home: Incision care   Follow instructions from your health care provider about how to take care of your incision. Make sure you: ? Wash your hands with soap and water before you change your bandage (dressing). If soap and water are not available, use hand sanitizer. ? If you have a dressing, change it or remove it as told by your health care provider. ? Leave stitches (sutures), skin staples, skin glue, or adhesive strips in place. These skin closures may need to stay in place for 2 weeks or longer. If adhesive strip edges start to loosen and curl up, you may trim the loose edges. Do not remove adhesive strips completely unless your health care provider tells you to do that.  Check your incision area every day for  signs of infection. Check for: ? More redness, swelling, or pain. ? More fluid or blood. ? Warmth. ? Pus or a bad smell.  Do not take baths, swim, or use a hot tub until your health care provider says it's okay. Ask your health care provider if you can take showers.  When you cough or sneeze, hug a pillow. This helps with pain and decreases the chance of your incision opening up (dehiscing). Do this until your incision heals. Medicines  Take over-the-counter and prescription medicines only as told by your health care provider.  If you were prescribed an antibiotic medicine, take it as told by your health care provider. Do not stop taking the antibiotic even if you start to feel better.  Do not drive or use heavy machinery while taking prescription pain medicine. Lifestyle  Do not drink alcohol. This is especially important if you are breastfeeding or taking pain medicine.  Do not use any products that contain nicotine or tobacco, such as cigarettes, e-cigarettes, and chewing tobacco. If you need help quitting, ask your health care provider. Eating and drinking  Drink at least 8 eight-ounce glasses of water every day unless told not to by your health care provider. If you breastfeed, you may need to drink even more water.  Eat high-fiber foods every day. These foods may help prevent or relieve constipation. High-fiber foods include: ? Whole grain cereals and breads. ? Brown rice. ? Beans. ? Fresh fruits and vegetables. Activity   If possible, have someone  help you care for your baby and help with household activities for at least a few days after you leave the hospital.  Return to your normal activities as told by your health care provider. Ask your health care provider what activities are safe for you.  Rest as much as possible. Try to rest or take a nap while your baby is sleeping.  Do not lift anything that is heavier than 10 lbs (4.5 kg), or the limit that you were told,  until your health care provider says that it is safe.  Talk with your health care provider about when you can engage in sexual activity. This may depend on your: ? Risk of infection. ? How fast you heal. ? Comfort and desire to engage in sexual activity. General instructions  Do not use tampons or douches until your health care provider approves.  Wear loose, comfortable clothing and a supportive and well-fitting bra.  Keep your perineum clean and dry. Wipe from front to back when you use the toilet.  If you pass a blood clot, save it and call your health care provider to discuss. Do not flush blood clots down the toilet before you get instructions from your health care provider.  Keep all follow-up visits for you and your baby as told by your health care provider. This is important. Contact a health care provider if:  You have: ? A fever. ? Bad-smelling vaginal discharge. ? Pus or a bad smell coming from your incision. ? Difficulty or pain when urinating. ? A sudden increase or decrease in the frequency of your bowel movements. ? More redness, swelling, or pain around your incision. ? More fluid or blood coming from your incision. ? A rash. ? Nausea. ? Little or no interest in activities you used to enjoy. ? Questions about caring for yourself or your baby.  Your incision feels warm to the touch.  Your breasts turn red or become painful or hard.  You feel unusually sad or worried.  You vomit.  You pass a blood clot from your vagina.  You urinate more than usual.  You are dizzy or light-headed. Get help right away if:  You have: ? Pain that does not go away or get better with medicine. ? Chest pain. ? Difficulty breathing. ? Blurred vision or spots in your vision. ? Thoughts about hurting yourself or your baby. ? New pain in your abdomen or in one of your legs. ? A severe headache.  You faint.  You bleed from your vagina so much that you fill more than one  sanitary pad in one hour. Bleeding should not be heavier than your heaviest period. Summary  After the procedure, it is common to have pain at your incision site, abdominal cramping, and slight bleeding from your vagina.  Check your incision area every day for signs of infection.  Tell your health care provider about any unusual symptoms.  Keep all follow-up visits for you and your baby as told by your health care provider. This information is not intended to replace advice given to you by your health care provider. Make sure you discuss any questions you have with your health care provider. Document Revised: 02/21/2018 Document Reviewed: 02/21/2018 Elsevier Patient Education  2020 [employer].

## 2020-06-07 LAB — URINE CULTURE: Culture: NO GROWTH

## 2020-06-09 ENCOUNTER — Ambulatory Visit (INDEPENDENT_AMBULATORY_CARE_PROVIDER_SITE_OTHER): Payer: Medicaid Other | Admitting: Obstetrics & Gynecology

## 2020-06-09 ENCOUNTER — Encounter: Payer: Self-pay | Admitting: Obstetrics & Gynecology

## 2020-06-09 ENCOUNTER — Other Ambulatory Visit: Payer: Self-pay

## 2020-06-09 VITALS — BP 150/100 | Ht 66.0 in | Wt 379.0 lb

## 2020-06-09 DIAGNOSIS — Z98891 History of uterine scar from previous surgery: Secondary | ICD-10-CM

## 2020-06-09 DIAGNOSIS — O165 Unspecified maternal hypertension, complicating the puerperium: Secondary | ICD-10-CM

## 2020-06-09 NOTE — Progress Notes (Signed)
  Postoperative Follow-up Patient presents post op from recent Cesarean Section performed for Arrest of Dilation, 1 week ago.  She has cHTN affecting pregnancy and had gestational diabetes as well as obesity affecting pregnancy.  She has OnQ and Prevena over incisions.  Subjective: Patient reports some improvement in her immediate post op symptoms. Eating a regular diet without difficulty. The patient is not having any pain.  Activity: sedentary. Patient reports additional symptom's since surgery of appropriate lochia, no signs of depression, and no signs of mastitis.  Pt denies ha, SOB, CP, blurry vision, epigastric pain.  Some edema, but improving.  On Procardia for HTN.  At home, BP range is 120-130s/80s.  Objective: BP (!) 150/100   Ht 5\' 6"  (1.676 m)   Wt (!) 379 lb (171.9 kg)   BMI 61.17 kg/m  Physical Exam Constitutional:      General: She is not in acute distress.    Appearance: She is well-developed.  Cardiovascular:     Rate and Rhythm: Normal rate.  Pulmonary:     Effort: Pulmonary effort is normal.  Abdominal:     General: There is no distension.     Palpations: Abdomen is soft.     Tenderness: There is no abdominal tenderness.     Comments: Incision Healing Well   Musculoskeletal:        General: Normal range of motion.  Neurological:     Mental Status: She is alert and oriented to person, place, and time.     Cranial Nerves: No cranial nerve deficit.  Skin:    General: Skin is warm and dry.     Assessment: s/p : Cesarean Section for Arrest of Dilation stable  cHTN  Plan: Patient has done well after her Cesarean Section with no apparent complications.  I have discussed the post-operative course to date, and the expected progress moving forward.  The patient understands what complications to be concerned about.  I will see the patient in routine follow up, or sooner if needed.    Activity plan: No heavy lifting.  Pelvic rest.  BP elevated here, reported  normal levels at home.  Will check labs.  Will return 48 hours for recheck. Counseled on possibilities of inpatient monitoring and treatment as well.  Prevena and OnQ removed, healing well.  Wound care instructions.  Marland Kitchen 06/09/2020, 10:14 AM

## 2020-06-10 ENCOUNTER — Encounter: Payer: Self-pay | Admitting: Obstetrics

## 2020-06-10 ENCOUNTER — Observation Stay
Admission: EM | Admit: 2020-06-10 | Discharge: 2020-06-12 | DRG: 776 | Disposition: A | Discharge status: Home / Self Care | Payer: Medicaid Other | Attending: Obstetrics and Gynecology | Admitting: Obstetrics and Gynecology

## 2020-06-10 ENCOUNTER — Other Ambulatory Visit: Payer: Self-pay

## 2020-06-10 DIAGNOSIS — Z20822 Contact with and (suspected) exposure to covid-19: Secondary | ICD-10-CM | POA: Diagnosis present

## 2020-06-10 DIAGNOSIS — O1495 Unspecified pre-eclampsia, complicating the puerperium: Principal | ICD-10-CM | POA: Diagnosis present

## 2020-06-10 DIAGNOSIS — R030 Elevated blood-pressure reading, without diagnosis of hypertension: Secondary | ICD-10-CM | POA: Diagnosis present

## 2020-06-10 DIAGNOSIS — O99215 Obesity complicating the puerperium: Secondary | ICD-10-CM | POA: Diagnosis present

## 2020-06-10 DIAGNOSIS — Z87891 Personal history of nicotine dependence: Secondary | ICD-10-CM

## 2020-06-10 LAB — CMP AND LIVER
ALT: 35 IU/L — ABNORMAL HIGH (ref 0–32)
AST: 45 IU/L — ABNORMAL HIGH (ref 0–40)
Albumin: 3.5 g/dL — ABNORMAL LOW (ref 3.9–5.0)
Alkaline Phosphatase: 146 IU/L — ABNORMAL HIGH (ref 44–121)
BUN: 10 mg/dL (ref 6–20)
Bilirubin Total: 0.4 mg/dL (ref 0.0–1.2)
Bilirubin, Direct: 0.16 mg/dL (ref 0.00–0.40)
CO2: 23 mmol/L (ref 20–29)
Calcium: 8.5 mg/dL — ABNORMAL LOW (ref 8.7–10.2)
Chloride: 103 mmol/L (ref 96–106)
Creatinine, Ser: 1.34 mg/dL — ABNORMAL HIGH (ref 0.57–1.00)
GFR calc Af Amer: 61 mL/min/{1.73_m2} (ref >59)
GFR calc non Af Amer: 53 mL/min/{1.73_m2} — ABNORMAL LOW (ref >59)
Glucose: 99 mg/dL (ref 65–99)
Potassium: 4.4 mmol/L (ref 3.5–5.2)
Sodium: 140 mmol/L (ref 134–144)
Total Protein: 5.9 g/dL — ABNORMAL LOW (ref 6.0–8.5)

## 2020-06-10 LAB — COMPREHENSIVE METABOLIC PANEL
ALT: 29 U/L (ref 0–44)
AST: 34 U/L (ref 15–41)
Albumin: 3.1 g/dL — ABNORMAL LOW (ref 3.5–5.0)
Alkaline Phosphatase: 112 U/L (ref 38–126)
Anion gap: 9 (ref 5–15)
BUN: 11 mg/dL (ref 6–20)
CO2: 26 mmol/L (ref 22–32)
Calcium: 8.6 mg/dL — ABNORMAL LOW (ref 8.9–10.3)
Chloride: 103 mmol/L (ref 98–111)
Creatinine, Ser: 1.37 mg/dL — ABNORMAL HIGH (ref 0.44–1.00)
GFR, Estimated: 52 mL/min — ABNORMAL LOW (ref >60)
Glucose, Bld: 104 mg/dL — ABNORMAL HIGH (ref 70–99)
Potassium: 4 mmol/L (ref 3.5–5.1)
Sodium: 138 mmol/L (ref 135–145)
Total Bilirubin: 0.6 mg/dL (ref 0.3–1.2)
Total Protein: 6.5 g/dL (ref 6.5–8.1)

## 2020-06-10 LAB — CBC
Hematocrit: 29.6 % — ABNORMAL LOW (ref 34.0–46.6)
Hemoglobin: 9.2 g/dL — ABNORMAL LOW (ref 11.1–15.9)
MCH: 23.1 pg — ABNORMAL LOW (ref 26.6–33.0)
MCHC: 31.1 g/dL — ABNORMAL LOW (ref 31.5–35.7)
MCV: 74 fL — ABNORMAL LOW (ref 79–97)
Platelets: 195 10*3/uL (ref 150–450)
RBC: 3.98 x10E6/uL (ref 3.77–5.28)
RDW: 16 % — ABNORMAL HIGH (ref 11.7–15.4)
WBC: 7.6 10*3/uL (ref 3.4–10.8)

## 2020-06-10 LAB — PROTEIN / CREATININE RATIO, URINE
Creatinine, Urine: 81.4 mg/dL
Creatinine, Urine: 97 mg/dL
Protein Creatinine Ratio: 0.33 mg/mg{Cre} — ABNORMAL HIGH (ref 0.00–0.15)
Protein, Ur: 30.8 mg/dL
Protein/Creat Ratio: 378 mg/g creat — ABNORMAL HIGH (ref 0–200)
Total Protein, Urine: 32 mg/dL

## 2020-06-10 LAB — RESPIRATORY PANEL BY RT PCR (FLU A&B, COVID)
Influenza A by PCR: NEGATIVE
Influenza B by PCR: NEGATIVE
SARS Coronavirus 2 by RT PCR: NEGATIVE

## 2020-06-10 MED ORDER — OXYCODONE-ACETAMINOPHEN 5-325 MG PO TABS: 1.0000 | ORAL_TABLET | ORAL | Status: DC | PRN

## 2020-06-10 MED ORDER — ONDANSETRON HCL 4 MG/2ML IJ SOLN: 4.0000 mg | Freq: Four times a day (QID) | INTRAMUSCULAR | Status: DC | PRN

## 2020-06-10 MED ORDER — LACTATED RINGERS IV SOLN
INTRAVENOUS | Status: DC
  Administered 2020-06-10: 1000 mL via INTRAVENOUS

## 2020-06-10 MED ORDER — ENOXAPARIN SODIUM 100 MG/ML ~~LOC~~ SOLN
0.5000 mg/kg | SUBCUTANEOUS | Status: DC
  Administered 2020-06-10: 85 mg via SUBCUTANEOUS
  Filled 2020-06-10 (×3): qty 1

## 2020-06-10 MED ORDER — LABETALOL HCL 5 MG/ML IV SOLN: 80.0000 mg | INTRAVENOUS | Status: DC | PRN

## 2020-06-10 MED ORDER — BENZOCAINE-MENTHOL 20-0.5 % EX AERO
INHALATION_SPRAY | CUTANEOUS | Status: AC
  Filled 2020-06-10: qty 56

## 2020-06-10 MED ORDER — MAGNESIUM SULFATE BOLUS VIA INFUSION
4.0000 g | Freq: Once | INTRAVENOUS | Status: AC
  Administered 2020-06-10: 4 g via INTRAVENOUS
  Filled 2020-06-10: qty 1000

## 2020-06-10 MED ORDER — LABETALOL HCL 5 MG/ML IV SOLN
20.0000 mg | Freq: Once | INTRAVENOUS | Status: AC
  Administered 2020-06-10: 20 mg via INTRAVENOUS

## 2020-06-10 MED ORDER — OXYCODONE-ACETAMINOPHEN 5-325 MG PO TABS: 2.0000 | ORAL_TABLET | ORAL | Status: DC | PRN

## 2020-06-10 MED ORDER — ACETAMINOPHEN 325 MG PO TABS
650.0000 mg | ORAL_TABLET | ORAL | Status: DC | PRN
  Administered 2020-06-11 (×3): 650 mg via ORAL
  Filled 2020-06-10 (×3): qty 2

## 2020-06-10 MED ORDER — LABETALOL HCL 5 MG/ML IV SOLN: 20.0000 mg | INTRAVENOUS | Status: DC | PRN

## 2020-06-10 MED ORDER — LABETALOL HCL 5 MG/ML IV SOLN: 40.0000 mg | INTRAVENOUS | Status: DC | PRN

## 2020-06-10 MED ORDER — MAGNESIUM SULFATE 40 GM/1000ML IV SOLN
2.0000 g/h | INTRAVENOUS | Status: DC
  Administered 2020-06-11: 2 g/h via INTRAVENOUS
  Filled 2020-06-10 (×2): qty 1000

## 2020-06-10 MED ORDER — NIFEDIPINE ER OSMOTIC RELEASE 30 MG PO TB24: 60.0000 mg | ORAL_TABLET | Freq: Every day | ORAL | Status: DC

## 2020-06-10 MED ORDER — CALCIUM GLUCONATE 10 % IV SOLN
INTRAVENOUS | Status: AC
  Filled 2020-06-10: qty 10

## 2020-06-10 MED ORDER — LABETALOL HCL 5 MG/ML IV SOLN
INTRAVENOUS | Status: AC
  Filled 2020-06-10: qty 4

## 2020-06-10 NOTE — ED Triage Notes (Signed)
Pt transported to [facility] 4

## 2020-06-10 NOTE — ED Triage Notes (Signed)
Pt sent by MD Tiburcio Pea from [facility] for elevation of creation and Hypertension. Pt to be admitted to L[facility] per pt. Pt just delivered on 06-03-2020  Spoke with L[facility] and they will talk with coordinator and call this RN back.

## 2020-06-10 NOTE — Lactation Note (Signed)
Lactation Consultation Note  Patient Name: Kelly Stevens [id number] Date: 06/10/2020 Reason for consult: Initial assessment  Lactation to the room to set Mother up with a pump. Mother was just admitted for increased BP's and requiring Mag. LC spoke with Mother about pumping. Mother states she pumps three times a day and she does not get enough milk so she uses formula for supplement. LC discussed and encouraged 8 pump sessions in 24 hours for a full milk supply. Discussed this can be difficult to do when on Mag but we encourage her to do what she can. LC asked how much her baby was taking. Mother stated about 27ml's per feed every 2-3 hours and using a pacifier. LC reviewed age appropriate volumes and Baby should be taking a minimum of 45 ml's and working towards 2-3 ounces by day 10 of life. He baby is now a week old. Mother stated she was worried about over feeding her. RN attempting to place IV, LC will come back later. 1600- LC to the room. Mother stated she was ready to get started with the pump. Reviewed pump set up on maintenance program, Pumping for a minimum of 15 minutes (typically 3-5 minutes past the milk spraying). Reviewed how to clean parts and soap at the bedside. Encouraged 8 pump sessions in 24 hours and typically not going more than one 4-5 hours stretch in 24 hours. Mother states understanding with all teaching.   Interventions Interventions: DEBP  Lactation Tools Discussed/Used Pump Review: Setup, frequency, and cleaning;Milk Storage Initiated by:: Lactation Date initiated:: 06/10/20   Consult Status Consult Status: Follow-up Date: 06/11/20 Follow-up type: In-patient    Kelly Stevens 06/10/2020, 4:13 PM

## 2020-06-10 NOTE — H&P (Addendum)
Kelly Stevens is a 29 y.o. female presenting for treatment of post delivery pre eclampsia with magnesium sulphate.Kelly Stevens was delivered 7 days ago via primary Cesarean section for arrest of dilation. Her pregnancy ws complicated by a number of risk factors, including: Gestational diabetes (insulin), hypertension, morbid obesity, prolonged active phase labor, and a hx of multiple SABs. Due to a rising Creatinine level during her inpatient stay, she was also seen by the [facility] team. A renal scan was performed, and additional labs were evaluated. She was discharged home 4 days ago on daily Procardia 60 mgXL. At her follow up appointment yesterday afternoon, her BPs were elevated (150/100), and pre eclamptic labs were drawn. Again her creatinine level is still high, her blood pressures continue to be elevated. After consulting with Dr. Bonne Dolores, she is admitted for 24 hours of treatment with magnesium sulphate and labwork. Today she denies any headache, blurred vision or epigastric pain.         Her legs and feet continue to be swollen. She is voiding regularly since delivery. She is pumping her breasts to provide milk for her baby. OB History    Gravida  4   Para  1   Term  1   Preterm      AB  3   Living  1     SAB  3   TAB      Ectopic      Multiple  0   Live Births  1          Past Medical History:  Diagnosis Date  . BMI 50.0-59.9, adult (HCC)   . Gestational diabetes   . Habitual aborter   . Headache    Past Surgical History:  Procedure Laterality Date  . CESAREAN SECTION  06/03/2020   Procedure: CESAREAN SECTION;  Surgeon: Kelly Mustard, MD;  Location: [facility] ORS;  Service: Obstetrics;;   Family History: family history includes Breast cancer (age of onset: 57) in her maternal aunt; Diabetes in her father, maternal aunt, maternal grandmother, and mother; Heart disease in her maternal grandmother; Hyperlipidemia in her maternal grandmother; Hypertension in  her father, maternal grandmother, and mother; Uterine cancer (age of onset: 73) in her maternal aunt. Social History:  reports that she quit smoking about 2 years ago. Her smoking use included cigarettes. She has a 0.40 pack-year smoking history. She has quit using smokeless tobacco. She reports that she does not drink alcohol and does not use drugs.     Maternal Diabetes: Yes:  Diabetes Type:  Insulin/Medication controlled Genetic Screening: Normal Maternal Ultrasounds/Referrals: Normal Fetal Ultrasounds or other Referrals:  None Maternal Substance Abuse:  No Significant Maternal Medications:  Meds include: Other: Insulin pre delivery Significant Maternal Lab Results:  Other:  See CMP, UPC ratio Other Comments:  None  Review of Systems  Constitutional: Negative.   HENT: Negative.   Eyes: Negative.   Respiratory: Negative.   Cardiovascular: Positive for leg swelling.  Gastrointestinal: Negative.   Endocrine: Negative.   Genitourinary: Positive for vaginal bleeding.  Musculoskeletal: Negative.   Skin: Negative.   Allergic/Immunologic: Negative.   Hematological: Negative.   Psychiatric/Behavioral: Negative.    History   Temperature 98.3 F (36.8 C), temperature source Oral, resp. rate 20, unknown if currently breastfeeding. Maternal Exam:  Introitus: Normal vulva.   Physical Exam Constitutional:      Appearance: Normal appearance. She is obese.  HENT:     Head: Normocephalic.     Mouth/Throat:  Mouth: Mucous membranes are moist.     Pharynx: Oropharynx is clear.  Cardiovascular:     Rate and Rhythm: Normal rate and regular rhythm.     Heart sounds: Normal heart sounds.  Pulmonary:     Effort: Pulmonary effort is normal.     Breath sounds: Normal breath sounds.  Abdominal:     General: Bowel sounds are normal.     Palpations: Abdomen is soft.  Genitourinary:    General: Normal vulva.     Comments: Scant lochia flow. Musculoskeletal:     Cervical back: Normal  range of motion and neck supple.     Right lower leg: Edema present.     Left lower leg: Edema present.  Skin:    General: Skin is warm and dry.  Neurological:     Mental Status: She is alert and oriented to person, place, and time.  Psychiatric:        Behavior: Behavior normal.     Prenatal labs: ABO, Rh: --/--/B POS (10/04 1752) Antibody: NEG (10/04 1752) Rubella: <0.90 (03/08 1054) RPR: NON REACTIVE (10/04 1752)  HBsAg: Negative (03/08 1054)  HIV: Non Reactive (07/15 1513)  GBS: Negative/-- (09/27 1607)   Assessment/Plan: 7 days post Cesarean delivery Pre eclampsia For treatment with magnesium sulphate.  Blood pressures 1 hour. PIH labs ordered, including CMP, UPC ratio, CBC Magnesium sulphate for pre eclampsia orders- 4 gm bolus, to be followed by 2 gm /hr infusion. Consider consultation with hospitalist group. CNM/ MD comanagement.   Kelly Stevens 06/10/2020, 12:41 PM

## 2020-06-11 ENCOUNTER — Ambulatory Visit: Payer: Medicaid Other | Admitting: Obstetrics and Gynecology

## 2020-06-11 DIAGNOSIS — O1495 Unspecified pre-eclampsia, complicating the puerperium: Secondary | ICD-10-CM | POA: Diagnosis not present

## 2020-06-11 LAB — CBC
HCT: 28.4 % — ABNORMAL LOW (ref 36.0–46.0)
Hemoglobin: 8.8 g/dL — ABNORMAL LOW (ref 12.0–15.0)
MCH: 23.5 pg — ABNORMAL LOW (ref 26.0–34.0)
MCHC: 31 g/dL (ref 30.0–36.0)
MCV: 75.7 fL — ABNORMAL LOW (ref 80.0–100.0)
Platelets: 209 10*3/uL (ref 150–400)
RBC: 3.75 MIL/uL — ABNORMAL LOW (ref 3.87–5.11)
RDW: 16.9 % — ABNORMAL HIGH (ref 11.5–15.5)
WBC: 6.6 10*3/uL (ref 4.0–10.5)
nRBC: 0 % (ref 0.0–0.2)

## 2020-06-11 MED ORDER — ENOXAPARIN SODIUM 100 MG/ML ~~LOC~~ SOLN
100.0000 mg | Freq: Every day | SUBCUTANEOUS | Status: DC
  Administered 2020-06-11: 100 mg via SUBCUTANEOUS
  Filled 2020-06-11 (×2): qty 1

## 2020-06-11 MED ORDER — NIFEDIPINE ER OSMOTIC RELEASE 30 MG PO TB24
90.0000 mg | ORAL_TABLET | Freq: Every day | ORAL | Status: DC
  Administered 2020-06-11: 90 mg via ORAL
  Filled 2020-06-11 (×2): qty 3

## 2020-06-11 MED ORDER — LABETALOL HCL 200 MG PO TABS
200.0000 mg | ORAL_TABLET | Freq: Three times a day (TID) | ORAL | Status: DC
  Administered 2020-06-11: 200 mg via ORAL
  Administered 2020-06-11: 100 mg via ORAL
  Administered 2020-06-11 – 2020-06-12 (×3): 200 mg via ORAL
  Filled 2020-06-11: qty 2
  Filled 2020-06-11 (×2): qty 1
  Filled 2020-06-11: qty 2
  Filled 2020-06-11 (×2): qty 1

## 2020-06-11 NOTE — Progress Notes (Signed)
Kelly Stevens is a 29 y.o. female patient.   Subjective:   She reports a mild headache which improved but did not resolve with tylenol. She is otherwise feeling well. Foley in place and is diuresing well. She feels that her legs are a lot less swollen. Still some swelling on both of her feet, but otherwise back to normal per the patient. She is pumping.  Objective:  Blood pressure 117/62, pulse 81, temperature 98.3 F (36.8 C), temperature source Oral, resp. rate 18, height 5\' 6"  (1.676 m), weight (!) 171.9 kg, SpO2 97 %, currently breastfeeding.     Past Medical History:  Diagnosis Date  . BMI 50.0-59.9, adult (HCC)   . Gestational diabetes   . Habitual aborter   . Headache     Current Facility-Administered Medications  Medication Dose Route Frequency Provider Last Rate Last Admin  . acetaminophen (TYLENOL) tablet 650 mg  650 mg Oral Q4H PRN 12-25-1994, CNM   650 mg at 06/11/20 06/13/20  . enoxaparin (LOVENOX) injection 85 mg  0.5 mg/kg Subcutaneous Q24H 8309, CNM   85 mg at 06/10/20 2233  . labetalol (NORMODYNE) injection 20 mg  20 mg Intravenous PRN 2234, CNM       And  . labetalol (NORMODYNE) injection 40 mg  40 mg Intravenous PRN [name], CNM       And  . labetalol (NORMODYNE) injection 80 mg  80 mg Intravenous PRN [name], CNM      . labetalol (NORMODYNE) tablet 200 mg  200 mg Oral TID [name] R, MD   100 mg at 06/11/20 0957  . lactated ringers infusion   Intravenous Continuous 06/13/20, CNM 75 mL/hr at 06/11/20 1130 Rate Verify at 06/11/20 1130  . magnesium sulfate 40 grams in SWI 1000 mL OB infusion  2 g/hr Intravenous Titrated 06/13/20, CNM 50 mL/hr at 06/11/20 1130 2 g/hr at 06/11/20 1130  . NIFEdipine (PROCARDIA-XL/NIFEDICAL-XL) 24 hr tablet 90 mg  90 mg Oral Daily Evann Erazo R, MD   90 mg at 06/11/20 0950  . ondansetron (ZOFRAN) injection 4 mg  4 mg Intravenous Q6H PRN 06/13/20, CNM      . oxyCODONE-acetaminophen (PERCOCET/ROXICET) 5-325 MG per tablet 1 tablet  1 tablet Oral Q4H PRN [name], CNM      . oxyCODONE-acetaminophen (PERCOCET/ROXICET) 5-325 MG per tablet 2 tablet  2 tablet Oral Q4H PRN [name], CNM       Allergies  Allergen Reactions  . Nickel Rash   Active Problems:   Pre-eclampsia in puerperium   Postpartum care following cesarean delivery  Blood pressure 117/62, pulse 81, temperature 98.3 F (36.8 C), temperature source Oral, resp. rate 18, height 5\' 6"  (1.676 m), weight (!) 171.9 kg, SpO2 97 %, currently breastfeeding.  Review of Systems  Constitutional: Negative for chills and fever.  HENT: Negative for congestion, hearing loss and sinus pain.   Respiratory: Negative for cough, shortness of breath and wheezing.   Cardiovascular: Negative for chest pain, palpitations and leg swelling.  Gastrointestinal: Negative for abdominal pain, constipation, diarrhea, nausea and vomiting.  Genitourinary: Negative for dysuria, flank pain, frequency, hematuria and urgency.  Musculoskeletal: Negative for back pain.  Skin: Negative for rash.  Neurological: Negative for dizziness and headaches.  Psychiatric/Behavioral: Negative for suicidal ideas. The patient is not nervous/anxious.     Physical Exam Vitals and nursing note reviewed.  Constitutional:  Appearance: She is well-developed.  HENT:     Head: Normocephalic and atraumatic.  Eyes:     Pupils: Pupils are equal, round, and reactive to light.  Cardiovascular:     Rate and Rhythm: Normal rate and regular rhythm.  Pulmonary:     Effort: Pulmonary effort is normal. No respiratory distress.  Skin:    General: Skin is warm and dry.  Neurological:     Mental Status: She is alert and oriented to person, place, and time.  Psychiatric:        Behavior: Behavior normal.        Thought Content: Thought content normal.        Judgment: Judgment normal.      Assessment:   29 y.o. [id number] postoperativeday # 8   Plan:  1) Preeclampsia- patient will be at 24 hour mark at 13:30 and magnesium will be discontinued. Foley will be removed and she will be transferred to postpartum. BP medications modified this AM. Increased to 90 mg Procardia and labetalol 200 mg TID.  2) DVT prophylaxis- continue Lovenox 80 mg for 3 weeks postpartum  3) Support Breast Feeding and pumping.   4) Disposition - Discussed continuing to monitor the patient's blood pressures until they are controlled before patient is discharged home.    Secret Kristensen R Marysa Wessner 06/11/2020

## 2020-06-12 DIAGNOSIS — Z87891 Personal history of nicotine dependence: Secondary | ICD-10-CM | POA: Diagnosis not present

## 2020-06-12 DIAGNOSIS — O99215 Obesity complicating the puerperium: Secondary | ICD-10-CM | POA: Diagnosis present

## 2020-06-12 DIAGNOSIS — Z20822 Contact with and (suspected) exposure to covid-19: Secondary | ICD-10-CM | POA: Diagnosis present

## 2020-06-12 DIAGNOSIS — R030 Elevated blood-pressure reading, without diagnosis of hypertension: Secondary | ICD-10-CM | POA: Diagnosis present

## 2020-06-12 DIAGNOSIS — O1495 Unspecified pre-eclampsia, complicating the puerperium: Secondary | ICD-10-CM | POA: Diagnosis not present

## 2020-06-12 MED ORDER — NIFEDIPINE ER OSMOTIC RELEASE 30 MG PO TB24
60.0000 mg | ORAL_TABLET | Freq: Every day | ORAL | Status: DC
  Administered 2020-06-12: 60 mg via ORAL
  Filled 2020-06-12: qty 2

## 2020-06-12 MED ORDER — NIFEDIPINE ER OSMOTIC RELEASE 30 MG PO TB24: 60.0000 mg | ORAL_TABLET | Freq: Every day | ORAL | Status: DC

## 2020-06-12 MED ORDER — ENOXAPARIN SODIUM 80 MG/0.8ML ~~LOC~~ SOLN
80.0000 mg | Freq: Every day | SUBCUTANEOUS | Status: DC
  Filled 2020-06-12: qty 0.8

## 2020-06-12 MED ORDER — LABETALOL HCL 200 MG PO TABS
200.0000 mg | ORAL_TABLET | Freq: Three times a day (TID) | ORAL | 1 refills | Status: DC
Start: 2020-06-12 — End: 2020-06-13

## 2020-06-12 NOTE — Progress Notes (Signed)
Pt discharged. Discharge instructions, prescriptions, and follow up appointments given to and reviewed with patient. Pt verbalized understanding. Escorted out by auxillary.

## 2020-06-12 NOTE — Discharge Summary (Signed)
Physician Discharge Summary  Patient ID: Kelly Stevens MRN: [id number] DOB/AGE: [birth year] 29 y.o.  Admit date: 06/10/2020 Discharge date: 06/12/2020  Admission Diagnoses: Pre eclampsia psot Cesarean Section  Discharge Diagnoses:  Active Problems:   Pre-eclampsia in puerperium   Postpartum care following cesarean delivery   Preeclampsia in postpartum period   Discharged Condition: good  Hospital Course: patient was admitted at 7 days post operative delivery for elevated blood pressures and a diagnosis of pre eclampsia. She was treated with magnesium sulphate per protocol for 24 hours and then transferred to the Mother Baby Postpartum unit for ongoing evaluation and medical control of her hypertension. She ahs responded well to labetalol and procardia, and is discharged home in care of family. She will follow up in 5-7 days at [facility].  Consults: None  Significant Diagnostic Studies: labs: See PIH panel  Treatments: IV hydration and antihypertensives  Discharge Exam: Blood pressure 129/78, pulse 89, temperature 98 F (36.7 C), temperature source Oral, resp. rate 18, height $RemoveBe'5\' 6"'gijHUVmsX$  (1.676 m), weight (!) 171.9 kg, SpO2 98 %, currently breastfeeding. General appearance: alert, cooperative and morbidly obese  Lungs CTA bilaterally  HRR Fundus is firm Surgical incision is healing well without drainage Lochia is scant Negative Homans sign Slight ankle and pedal edema    Disposition:  Discharge disposition: 01-Home or Self Care       Discharge Instructions    Call MD for:   Complete by: As directed    Call MD for:  difficulty breathing, headache or visual disturbances   Complete by: As directed    Call MD for:  extreme fatigue   Complete by: As directed    Call MD for:  hives   Complete by: As directed    Call MD for:  persistant dizziness or light-headedness   Complete by: As directed    Call MD for:  persistant nausea and vomiting   Complete by: As  directed    Call MD for:  redness, tenderness, or signs of infection (pain, swelling, redness, odor or green/yellow discharge around incision site)   Complete by: As directed    Call MD for:  severe uncontrolled pain   Complete by: As directed    Call MD for:  temperature >100.4   Complete by: As directed    Diet general   Complete by: As directed    Driving restriction   Complete by: As directed    Avoid driving for at least 2 weeks.or while taking prescription narcotics   Leave dressing on - Keep it clean, dry, and intact until clinic visit   Complete by: As directed    Lifting restrictions   Complete by: As directed    Weight restriction of 10 lbs. For 6 weeks   Other restrictions (specify):   Complete by: As directed    No baths for the first 6 weeks.  You may take showers and use soap.  Ensure that you adequately dry the incision after showering   Sexual activity   Complete by: As directed    No intercourse for 6 weeks     Allergies as of 06/12/2020      Reactions   Nickel Rash            Signed: Imagene Riches 06/12/2020, 2:09 PM

## 2020-06-13 ENCOUNTER — Other Ambulatory Visit: Payer: Self-pay | Admitting: Obstetrics & Gynecology

## 2020-06-13 MED ORDER — LABETALOL HCL 200 MG PO TABS
200.0000 mg | ORAL_TABLET | Freq: Three times a day (TID) | ORAL | 1 refills | Status: DC
Start: 2020-06-13 — End: 2020-08-19

## 2020-06-13 NOTE — Progress Notes (Signed)
06/13/2020 1130 am  Patient called [facility] stating her pharmacy did not have new BP medication ordered from when she discharged 06/12/2020. Dr. Tiburcio Pea on call notified, will send prescription again. Patient updated and thanked staff.

## 2020-06-16 ENCOUNTER — Other Ambulatory Visit: Payer: Self-pay

## 2020-06-16 ENCOUNTER — Ambulatory Visit (INDEPENDENT_AMBULATORY_CARE_PROVIDER_SITE_OTHER): Payer: Medicaid Other | Admitting: Obstetrics and Gynecology

## 2020-06-16 VITALS — BP 126/86 | Ht 66.0 in | Wt 344.0 lb

## 2020-06-16 DIAGNOSIS — Z0130 Encounter for examination of blood pressure without abnormal findings: Secondary | ICD-10-CM

## 2020-06-16 NOTE — Progress Notes (Signed)
Obstetrics & Gynecology Office Visit   Chief Complaint:  Chief Complaint  Patient presents with  . Postpartum Care    History of Present Illness: 29 y.o. [id number] being seen for follow up blood pressure check today.  The patient is postpartumThe established diagnosis for the patient is chronic hypertension.  She is currently on labetalol 600mg   Tid, procardia XL 90mg  daily, and HCTZ 25mg  po daily  She reports no current symptoms attributable to her blood pressure.  Medication list reviewed medications which may contribute to BP elevation were not noted.  Review of Systems: Review of Systems  Constitutional: Negative.   Cardiovascular: Negative for chest pain.  Neurological: Negative for headaches.     Past Medical History:  Past Medical History:  Diagnosis Date  . BMI 50.0-59.9, adult (HCC)   . Gestational diabetes   . Habitual aborter   . Headache     Past Surgical History:  Past Surgical History:  Procedure Laterality Date  . CESAREAN SECTION  06/03/2020   Procedure: CESAREAN SECTION;  Surgeon: , MD;  Location: [facility] ORS;  Service: Obstetrics;;    Gynecologic History: No LMP recorded.  Obstetric History: 12-25-1994  Family History:  Family History  Problem Relation Age of Onset  . Breast cancer Maternal Aunt 40  . Uterine cancer Maternal Aunt 34  . Diabetes Mother   . Hypertension Mother   . Diabetes Father   . Hypertension Father   . Diabetes Maternal Grandmother   . Hypertension Maternal Grandmother   . Hyperlipidemia Maternal Grandmother   . Heart disease Maternal Grandmother   . Diabetes Maternal Aunt     Social History:  Social History   Socioeconomic History  . Marital status: Married    Spouse name: 08/03/2020  . Number of children: Not on file  . Years of education: Not on file  . Highest education level: Not on file  Occupational History  . Not on file  Tobacco Use  . Smoking status: Former Smoker    Packs/day: 0.10     Years: 4.00    Pack years: 0.40    Types: Cigarettes    Quit date: 09/25/2017    Years since quitting: 2.7  . Smokeless tobacco: Former [id number]  . Tobacco comment: not consistent  Vaping Use  . Vaping Use: Never used  Substance and Sexual Activity  . Alcohol use: No  . Drug use: No  . Sexual activity: Yes    Birth control/protection: Pill    Comment: Wants education  Other Topics Concern  . Not on file  Social History Narrative  . Not on file   Social Determinants of Health   Financial Resource Strain:   . Difficulty of Paying Living Expenses: Not on file  Food Insecurity:   . Worried About Para March in the Last Year: Not on file  . Ran Out of Food in the Last Year: Not on file  Transportation Needs:   . Lack of Transportation (Medical): Not on file  . Lack of Transportation (Non-Medical): Not on file  Physical Activity:   . Days of Exercise per Week: Not on file  . Minutes of Exercise per Session: Not on file  Stress:   . Feeling of Stress : Not on file  Social Connections:   . Frequency of Communication with Friends and Family: Not on file  . Frequency of Social Gatherings with Friends and Family: Not on file  . Attends Religious Services: Not on  file  . Active Member of Clubs or Organizations: Not on file  . Attends Banker Meetings: Not on file  . Marital Status: Not on file  Intimate Partner Violence:   . Fear of Current or Ex-Partner: Not on file  . Emotionally Abused: Not on file  . Physically Abused: Not on file  . Sexually Abused: Not on file    Allergies:  Allergies  Allergen Reactions  . Nickel Rash    Medications: Prior to Admission medications   Medication Sig Start Date End Date Taking? Authorizing Provider  acetaminophen (TYLENOL) 325 MG tablet Take 2 tablets (650 mg total) by mouth every 4 (four) hours as needed for mild pain (temperature > 101.5.). 06/05/20  Yes Schuman, Christanna R, MD  enoxaparin (LOVENOX) 80 MG/0.8ML  injection Inject 0.8 mLs (80 mg total) into the skin daily for 21 days. 06/05/20 06/26/20 Yes Schuman, Christanna R, MD  labetalol (NORMODYNE) 200 MG tablet Take 1 tablet (200 mg total) by mouth 3 (three) times daily. 06/13/20  Yes Nadara Mustard, MD  NIFEdipine (PROCARDIA XL) 60 MG 24 hr tablet Take 1 tablet (60 mg total) by mouth daily. 12/23/19  Yes Schuman, Christanna R, MD  oxyCODONE (OXY IR/ROXICODONE) 5 MG immediate release tablet Take 1 tablet (5 mg total) by mouth every 6 (six) hours as needed for moderate pain or severe pain. 06/05/20  Yes Schuman, Jaquelyn Bitter, MD  Prenatal Vit-Fe Fumarate-FA (PRENATAL MULTIVITAMIN) TABS tablet Take 1 tablet by mouth daily at 12 noon.   Yes [provider]    Physical Exam Blood pressure 126/86, height 5\' 6"  (1.676 m), weight (!) 344 lb (156 kg), currently breastfeeding.  No LMP recorded.  General: NAD HEENT: normocephalic, anicteric Pulmonary: No increased work of breathing Incision D/C/I Neurologic: Grossly intact Psychiatric: mood appropriate, affect full  Assessment: 29 y.o. 25 presenting for blood pressure evaluation today  Plan: Problem List Items Addressed This Visit    None      1) Blood pressure - blood pressure at today's visit is normotensive.  As a result adjustments were made to the patient's antihypertensive therapy. - additional blood work was not obtained  - decrease labetalol from TID to BID - continue procardia XL 60mg   2) [place] 9 - states doing well monitor   , MD, 12-02-1973 OB/GYN, [facility] 06/16/2020, 2:37 PM

## 2020-06-22 ENCOUNTER — Other Ambulatory Visit: Payer: Self-pay

## 2020-06-22 ENCOUNTER — Ambulatory Visit (INDEPENDENT_AMBULATORY_CARE_PROVIDER_SITE_OTHER): Payer: Medicaid Other | Admitting: Obstetrics and Gynecology

## 2020-06-22 ENCOUNTER — Encounter: Payer: Self-pay | Admitting: Obstetrics and Gynecology

## 2020-06-22 VITALS — BP 148/98 | Ht 66.0 in | Wt 344.0 lb

## 2020-06-22 DIAGNOSIS — O1495 Unspecified pre-eclampsia, complicating the puerperium: Secondary | ICD-10-CM

## 2020-06-22 DIAGNOSIS — Z0130 Encounter for examination of blood pressure without abnormal findings: Secondary | ICD-10-CM

## 2020-06-22 NOTE — Progress Notes (Signed)
Obstetrics & Gynecology Office Visit   Chief Complaint:  Chief Complaint  Patient presents with  . Blood Pressure Check    History of Present Illness: 29 y.o. [id number] being seen for follow up blood pressure check today.  The patient is postpartumThe established diagnosis for the patient is chronic hypertension.  She is currently on labetalol 200mg   Bid, procardia XL 60mg  daily po daily  She reports no current symptoms attributable to her blood pressure.  Medication list reviewed medications which may contribute to BP elevation were not noted.  Review of Systems: Review of Systems  Constitutional: Negative.   Cardiovascular: Negative for chest pain.  Neurological: Negative for headaches.     Past Medical History:  Past Medical History:  Diagnosis Date  . BMI 50.0-59.9, adult (HCC)   . Gestational diabetes   . Habitual aborter   . Headache     Past Surgical History:  Past Surgical History:  Procedure Laterality Date  . CESAREAN SECTION  06/03/2020   Procedure: CESAREAN SECTION;  Surgeon: 12-25-1994, MD;  Location: [facility] ORS;  Service: Obstetrics;;    Gynecologic History: No LMP recorded.  Obstetric History: 08/03/2020  Family History:  Family History  Problem Relation Age of Onset  . Breast cancer Maternal Aunt 40  . Uterine cancer Maternal Aunt 34  . Diabetes Mother   . Hypertension Mother   . Diabetes Father   . Hypertension Father   . Diabetes Maternal Grandmother   . Hypertension Maternal Grandmother   . Hyperlipidemia Maternal Grandmother   . Heart disease Maternal Grandmother   . Diabetes Maternal Aunt     Social History:  Social History   Socioeconomic History  . Marital status: Married    Spouse name: Nadara Mustard  . Number of children: Not on file  . Years of education: Not on file  . Highest education level: Not on file  Occupational History  . Not on file  Tobacco Use  . Smoking status: Former Smoker    Packs/day: 0.10    Years: 4.00      Pack years: 0.40    Types: Cigarettes    Quit date: 09/25/2017    Years since quitting: 2.7  . Smokeless tobacco: Former Para March  . Tobacco comment: not consistent  Vaping Use  . Vaping Use: Never used  Substance and Sexual Activity  . Alcohol use: No  . Drug use: No  . Sexual activity: Yes    Birth control/protection: Pill    Comment: Wants education  Other Topics Concern  . Not on file  Social History Narrative  . Not on file   Social Determinants of Health   Financial Resource Strain:   . Difficulty of Paying Living Expenses: Not on file  Food Insecurity:   . Worried About 09/27/2017 in the Last Year: Not on file  . Ran Out of Food in the Last Year: Not on file  Transportation Needs:   . Lack of Transportation (Medical): Not on file  . Lack of Transportation (Non-Medical): Not on file  Physical Activity:   . Days of Exercise per Week: Not on file  . Minutes of Exercise per Session: Not on file  Stress:   . Feeling of Stress : Not on file  Social Connections:   . Frequency of Communication with Friends and Family: Not on file  . Frequency of Social Gatherings with Friends and Family: Not on file  . Attends Religious Services: Not on file  .  Active Member of Clubs or Organizations: Not on file  . Attends Banker Meetings: Not on file  . Marital Status: Not on file  Intimate Partner Violence:   . Fear of Current or Ex-Partner: Not on file  . Emotionally Abused: Not on file  . Physically Abused: Not on file  . Sexually Abused: Not on file    Allergies:  Allergies  Allergen Reactions  . Nickel Rash    Medications: Prior to Admission medications   Medication Sig Start Date End Date Taking? Authorizing Provider  enoxaparin (LOVENOX) 80 MG/0.8ML injection Inject 0.8 mLs (80 mg total) into the skin daily for 21 days. 06/05/20 06/26/20 Yes Schuman, Christanna R, MD  labetalol (NORMODYNE) 200 MG tablet Take 1 tablet (200 mg total) by mouth 2  (two) times daily. 06/13/20  Yes Nadara Mustard, MD  NIFEdipine (PROCARDIA XL) 60 MG 24 hr tablet Take 1 tablet (60 mg total) by mouth daily. 12/23/19  Yes Schuman, Jaquelyn Bitter, MD  Prenatal Vit-Fe Fumarate-FA (PRENATAL MULTIVITAMIN) TABS tablet Take 1 tablet by mouth daily at 12 noon.   Yes [provider]    Physical Exam BP (!) 148/98   Ht 5\' 6"  (1.676 m)   Wt (!) 344 lb (156 kg)   BMI 55.52 kg/m  .  No LMP recorded.  General: NAD HEENT: normocephalic, anicteric Pulmonary: No increased work of breathing Incision D/C/I Neurologic: Grossly intact Psychiatric: mood appropriate, affect full  Assessment: 29 y.o. 25 presenting for blood pressure evaluation today  Plan: Problem List Items Addressed This Visit      Cardiovascular and Mediastinum   Preeclampsia in postpartum period - Primary    Other Visit Diagnoses    BP check          Blood pressure - blood pressure at today's visit is normotensive.  As a result adjustments were made to the patient's antihypertensive therapy. - additional blood work was not obtained  - continue labetalol BID - continue procardia XL 60mg    [id number], MD, OB/GYN, [facility] 06/22/2020 3:51 PM

## 2020-06-27 ENCOUNTER — Emergency Department
Admission: EM | Admit: 2020-06-27 | Discharge: 2020-06-27 | Disposition: A | Discharge status: Home / Self Care | Payer: Medicaid Other | Attending: Emergency Medicine | Admitting: Emergency Medicine

## 2020-06-27 ENCOUNTER — Other Ambulatory Visit: Payer: Self-pay

## 2020-06-27 ENCOUNTER — Emergency Department: Payer: Medicaid Other

## 2020-06-27 DIAGNOSIS — R300 Dysuria: Secondary | ICD-10-CM | POA: Insufficient documentation

## 2020-06-27 DIAGNOSIS — Z87891 Personal history of nicotine dependence: Secondary | ICD-10-CM | POA: Insufficient documentation

## 2020-06-27 DIAGNOSIS — L03115 Cellulitis of right lower limb: Secondary | ICD-10-CM

## 2020-06-27 DIAGNOSIS — M79661 Pain in right lower leg: Secondary | ICD-10-CM | POA: Diagnosis present

## 2020-06-27 LAB — COMPREHENSIVE METABOLIC PANEL
ALT: 20 U/L (ref 0–44)
AST: 25 U/L (ref 15–41)
Albumin: 3.8 g/dL (ref 3.5–5.0)
Alkaline Phosphatase: 103 U/L (ref 38–126)
Anion gap: 11 (ref 5–15)
BUN: 13 mg/dL (ref 6–20)
CO2: 24 mmol/L (ref 22–32)
Calcium: 8.5 mg/dL — ABNORMAL LOW (ref 8.9–10.3)
Chloride: 105 mmol/L (ref 98–111)
Creatinine, Ser: 1.3 mg/dL — ABNORMAL HIGH (ref 0.44–1.00)
GFR, Estimated: 57 mL/min — ABNORMAL LOW (ref >60)
Glucose, Bld: 96 mg/dL (ref 70–99)
Potassium: 3.6 mmol/L (ref 3.5–5.1)
Sodium: 140 mmol/L (ref 135–145)
Total Bilirubin: 0.7 mg/dL (ref 0.3–1.2)
Total Protein: 6.8 g/dL (ref 6.5–8.1)

## 2020-06-27 LAB — URINALYSIS, COMPLETE (UACMP) WITH MICROSCOPIC
Bilirubin Urine: NEGATIVE
Glucose, UA: NEGATIVE mg/dL
Ketones, ur: NEGATIVE mg/dL
Nitrite: NEGATIVE
Protein, ur: NEGATIVE mg/dL
Specific Gravity, Urine: 1.014 (ref 1.005–1.030)
pH: 5 (ref 5.0–8.0)

## 2020-06-27 LAB — CBC WITH DIFFERENTIAL/PLATELET
Abs Immature Granulocytes: 0.01 10*3/uL (ref 0.00–0.07)
Basophils Absolute: 0 10*3/uL (ref 0.0–0.1)
Basophils Relative: 0 %
Eosinophils Absolute: 0.2 10*3/uL (ref 0.0–0.5)
Eosinophils Relative: 4 %
HCT: 33.5 % — ABNORMAL LOW (ref 36.0–46.0)
Hemoglobin: 10.1 g/dL — ABNORMAL LOW (ref 12.0–15.0)
Immature Granulocytes: 0 %
Lymphocytes Relative: 38 %
Lymphs Abs: 1.9 10*3/uL (ref 0.7–4.0)
MCH: 22.7 pg — ABNORMAL LOW (ref 26.0–34.0)
MCHC: 30.1 g/dL (ref 30.0–36.0)
MCV: 75.3 fL — ABNORMAL LOW (ref 80.0–100.0)
Monocytes Absolute: 0.5 10*3/uL (ref 0.1–1.0)
Monocytes Relative: 10 %
Neutro Abs: 2.3 10*3/uL (ref 1.7–7.7)
Neutrophils Relative %: 48 %
Platelets: 411 10*3/uL — ABNORMAL HIGH (ref 150–400)
RBC: 4.45 MIL/uL (ref 3.87–5.11)
RDW: 15.1 % (ref 11.5–15.5)
WBC: 4.9 10*3/uL (ref 4.0–10.5)
nRBC: 0 % (ref 0.0–0.2)

## 2020-06-27 MED ORDER — CEPHALEXIN 500 MG PO CAPS
500.0000 mg | ORAL_CAPSULE | Freq: Once | ORAL | Status: AC
  Administered 2020-06-27: 500 mg via ORAL
  Filled 2020-06-27: qty 1

## 2020-06-27 MED ORDER — CEPHALEXIN 500 MG PO CAPS: 500.0000 mg | ORAL_CAPSULE | Freq: Three times a day (TID) | ORAL | 0 refills | Status: AC

## 2020-06-27 NOTE — ED Triage Notes (Addendum)
Pt reports x2-3 days pain to front of right leg - Pt reports that right leg feels hot, warm, red, and sore to touch Pt is on lovenox currently Pt is 3 weeks post c-section delivery

## 2020-06-27 NOTE — ED Provider Notes (Signed)
[facility] Emergency Department Provider Note  ____________________________________________   First MD Initiated Contact with Patient 06/27/20 1623     (approximate)  I have reviewed the triage vital signs and the nursing notes.   HISTORY  Chief Complaint Leg Pain  HPI Kelly Stevens is a 29 y.o. female the emergency department for evaluation of right lower leg pain, redness and swelling.  The patient states that it has been present for 3 days.  Of note, she delivered a baby via C-section just over 3 weeks ago and was placed on prophylactic Lovenox following this procedure.  She has been taking this consistently but admits she has not taken today's dose yet.  She states that she has had cellulitis before and in some ways this feels similar and that it feels warm, however her other episode of lower extremity cellulitis was much more severe than this.  She denies pain unless the area is touched, and then she rates her pain a 4/10.  She denies any pain in the posterior aspect of the calf.  She denies any drainage or openings of her C-section wound.  Denies systemic symptoms of fever, chills, shortness of breath, chest pain.  When asked about dysuria, the patient states that it feels abnormal to pee, but does not describe it as a burning that she usually associates with UTI.  She states it feels more like a pressure similar to when one is catheterized.  She denies any foul smell of her urine, hematuria, pyuria.         Past Medical History:  Diagnosis Date   BMI 50.0-59.9, adult (HCC)    Gestational diabetes    Habitual aborter    Headache     Patient Active Problem List   Diagnosis Date Noted   Preeclampsia in postpartum period 06/12/2020   Pre-eclampsia in puerperium 06/10/2020   Postpartum care following cesarean delivery    S/P cesarean section 06/09/2020   Hypertension, postpartum condition or complication 06/09/2020   Acute kidney injury  (HCC) 06/06/2020   Failure to progress in labor 06/03/2020   Labor and delivery, indication for care 06/01/2020   Gestational diabetes mellitus (GDM) in third trimester 03/27/2020   BMI 50.0-59.9, adult (HCC) 11/11/2019   Supervision of high-risk pregnancy 11/04/2019   Obesity in pregnancy, antepartum 11/04/2019   Chronic hypertension affecting pregnancy 11/04/2019   Family history of breast cancer 01/10/2019    Past Surgical History:  Procedure Laterality Date   CESAREAN SECTION  06/03/2020   Procedure: CESAREAN SECTION;  Surgeon: Nadara Mustard, MD;  Location: [facility] ORS;  Service: Obstetrics;;    Prior to Admission medications   Medication Sig Start Date End Date Taking? Authorizing Provider  acetaminophen (TYLENOL) 325 MG tablet Take 2 tablets (650 mg total) by mouth every 4 (four) hours as needed for mild pain (temperature > 101.5.). 06/05/20   Schuman, Jaquelyn Bitter, MD  cephALEXin (KEFLEX) 500 MG capsule Take 1 capsule (500 mg total) by mouth 3 (three) times daily for 10 days. 06/27/20 07/07/20  Orvil Feil, PA-C  enoxaparin (LOVENOX) 80 MG/0.8ML injection Inject 0.8 mLs (80 mg total) into the skin daily for 21 days. 06/05/20 06/26/20  Schuman, Jaquelyn Bitter, MD  labetalol (NORMODYNE) 200 MG tablet Take 1 tablet (200 mg total) by mouth 3 (three) times daily. 06/13/20   Nadara Mustard, MD  NIFEdipine (PROCARDIA XL) 60 MG 24 hr tablet Take 1 tablet (60 mg total) by mouth daily. 12/23/19   Schuman, Denman George  R, MD  oxyCODONE (OXY IR/ROXICODONE) 5 MG immediate release tablet Take 1 tablet (5 mg total) by mouth every 6 (six) hours as needed for moderate pain or severe pain. 06/05/20   Schuman, Jaquelyn Bitter, MD  Prenatal Vit-Fe Fumarate-FA (PRENATAL MULTIVITAMIN) TABS tablet Take 1 tablet by mouth daily at 12 noon.    [provider]    Allergies Nickel  Family History  Problem Relation Age of Onset   Breast cancer Maternal Aunt 40   Uterine cancer Maternal Aunt  55   Diabetes Mother    Hypertension Mother    Diabetes Father    Hypertension Father    Diabetes Maternal Grandmother    Hypertension Maternal Grandmother    Hyperlipidemia Maternal Grandmother    Heart disease Maternal Grandmother    Diabetes Maternal Aunt     Social History Social History   Tobacco Use   Smoking status: Former Smoker    Packs/day: 0.10    Years: 4.00    Pack years: 0.40    Types: Cigarettes    Quit date: 09/25/2017    Years since quitting: 2.7   Smokeless tobacco: Former Neurosurgeon   Tobacco comment: not consistent  Building services engineer Use: Never used  Substance Use Topics   Alcohol use: No   Drug use: No    Review of Systems Constitutional: No fever/chills Eyes: No visual changes. ENT: No sore throat. Cardiovascular: Denies chest pain. Respiratory: Denies shortness of breath. Gastrointestinal: No abdominal pain.  No nausea, no vomiting.  No diarrhea.  No constipation. Genitourinary: + dysuria. Musculoskeletal: + Right lower leg pain, negative for back pain. Skin: Negative for rash. Neurological: Negative for headaches, focal weakness or numbness.   ____________________________________________   PHYSICAL EXAM:  VITAL SIGNS: ED Triage Vitals  Enc Vitals Group     BP 06/27/20 1513 130/85     Pulse Rate 06/27/20 1513 79     Resp 06/27/20 1513 16     Temp 06/27/20 1513 97.9 F (36.6 C)     Temp Source 06/27/20 1513 Oral     SpO2 06/27/20 1513 100 %     Weight 06/27/20 1514 (!) 325 lb (147.4 kg)     Height 06/27/20 1514 5\' 6"  (1.676 m)     Head Circumference --      Peak Flow --      Pain Score 06/27/20 1513 0     Pain Loc --      Pain Edu? --      Excl. in GC? --     Constitutional: Alert and oriented. Well appearing and in no acute distress. Eyes: Conjunctivae are normal. PERRL. EOMI. Head: Atraumatic. Nose: No congestion/rhinnorhea. Mouth/Throat: Mucous membranes are moist.   Neck: No stridor.   Cardiovascular:  Normal rate, regular rhythm. Grossly normal heart sounds.  Good peripheral circulation. Respiratory: Normal respiratory effort.  No retractions. Lungs CTAB. Gastrointestinal: Soft and nontender. No distention. No abdominal bruits. No CVA tenderness. Musculoskeletal: Full range of motion of the bilateral lower extremities including ankles and knees.  No tenderness to palpation of the posterior calf region.  There is tenderness to palpation over the distal third of the medial aspect of the tibia with some mild erythema noted in this region.  No appreciable swelling noted. Neurologic:  Normal speech and language. No gross focal neurologic deficits are appreciated. No gait instability. Skin: Cesarean incision visualized is closed with no dehiscence or drainage noted.  Mild erythematous region of the right lower leg  as described above.  No warmth appreciated. Psychiatric: Mood and affect are normal. Speech and behavior are normal.  ____________________________________________   LABS (all labs ordered are listed, but only abnormal results are displayed)  Labs Reviewed  CBC WITH DIFFERENTIAL/PLATELET - Abnormal; Notable for the following components:      Result Value   Hemoglobin 10.1 (*)    HCT 33.5 (*)    MCV 75.3 (*)    MCH 22.7 (*)    Platelets 411 (*)    All other components within normal limits  COMPREHENSIVE METABOLIC PANEL - Abnormal; Notable for the following components:   Creatinine, Ser 1.30 (*)    Calcium 8.5 (*)    GFR, Estimated 57 (*)    All other components within normal limits  URINALYSIS, COMPLETE (UACMP) WITH MICROSCOPIC - Abnormal; Notable for the following components:   Color, Urine YELLOW (*)    APPearance HAZY (*)    Hgb urine dipstick MODERATE (*)    Leukocytes,Ua MODERATE (*)    Bacteria, UA RARE (*)    All other components within normal limits  URINE CULTURE   ____________________________________________  RADIOLOGY  Official radiology report(s): US Venous  Img Lower Unilateral Right  Result Date: 06/27/2020 CLINICAL DATA:  29 year old with redness and swelling in the right lower extremity. EXAM: RIGHT LOWER EXTREMITY VENOUS DOPPLER ULTRASOUND TECHNIQUE: Gray-scale sonography with graded compression, as well as color Doppler and duplex ultrasound were performed to evaluate the lower extremity deep venous systems from the level of the common femoral vein and including the common femoral, femoral, profunda femoral, popliteal and calf veins including the posterior tibial, peroneal and gastrocnemius veins when visible. The superficial great saphenous vein was also interrogated. Spectral Doppler was utilized to evaluate flow at rest and with distal augmentation maneuvers in the common femoral, femoral and popliteal veins. COMPARISON:  None. FINDINGS: Contralateral Common Femoral Vein: Respiratory phasicity is normal and symmetric with the symptomatic side. No evidence of thrombus. Normal compressibility. Common Femoral Vein: No evidence of thrombus. Normal compressibility, respiratory phasicity and response to augmentation. Saphenofemoral Junction: No evidence of thrombus. Normal compressibility and flow on color Doppler imaging. Profunda Femoral Vein: No evidence of thrombus. Normal compressibility and flow on color Doppler imaging. Femoral Vein: No evidence of thrombus. Normal compressibility, respiratory phasicity and response to augmentation. Popliteal Vein: No evidence of thrombus. Normal compressibility, respiratory phasicity and response to augmentation. Calf Veins: No evidence of thrombus. Normal compressibility and flow on color Doppler imaging. Difficult to compress the distal posterior tibial veins at the area of concern but this area is compressible and there is color Doppler flow. No evidence for calf DVT. Other Findings:  None. IMPRESSION: Negative for deep venous thrombosis in right lower extremity. Electronically Signed   By: Richarda Overlie M.D.   On:  06/27/2020 16:55   ____________________________________________   INITIAL IMPRESSION / ASSESSMENT AND PLAN / ED COURSE  As part of my medical decision making, I reviewed the following data within the electronic MEDICAL RECORD NUMBER Nursing notes reviewed and incorporated, Labs reviewed from today as well as admission from 3 weeks ago and Old chart reviewed from admission 3 weeks ago.        Patient is a 29 year old female just over 3 weeks status post cesarean delivery of a baby girl who presents to the emergency department primarily with complaint of right lower leg redness and pain as well as some vague description of dysuria.  See HPI for further details  Physical exam does reveal  some mild erythema to the anterior shin on the right lower extremity that is about palm sized and tender to touch.  No warmth appreciated.  She has no calf pain and full range of motion of her extremities.  Though it would be unlikely given that the patient is on Lovenox, evaluation began with a lower extremity Doppler to rule out DVT, and this was negative.  Laboratory evaluation including CMP and CBC were utilized.  Her CBC and CMP are grossly improved from hospital admission 3 weeks ago.  Most notably, the patient does not have an elevated white count and kidney function has mild improvement since that time.  Urinalysis reveals moderate hemoglobin, moderate leukocytes and rare bacteria noted.  This is largely similar to urine from hospital admission.  Will order urine culture to rule out any urinary tract infection.  In the interim, the patient's lower extremity exam is most consistent with early cellulitis.  Prescribed the patient Keflex.  She should continue follow-up with her OB as scheduled.  She was advised to return to the emergency department if she has any worsening of her redness, pain or swelling.  The patient is amenable with this plan and is stable at this time for outpatient therapy.       ____________________________________________   FINAL CLINICAL IMPRESSION(S) / ED DIAGNOSES  Final diagnoses:  Cellulitis of right lower extremity     ED Discharge Orders         Ordered    cephALEXin (KEFLEX) 500 MG capsule  3 times daily        06/27/20 1817          *Please note:  Kelly Stevens was evaluated in Emergency Department on 06/27/2020 for the symptoms described in the history of present illness. She was evaluated in the context of the global COVID-19 pandemic, which necessitated consideration that the patient might be at risk for infection with the SARS-CoV-2 virus that causes COVID-19. Institutional protocols and algorithms that pertain to the evaluation of patients at risk for COVID-19 are in a state of rapid change based on information released by regulatory bodies including the CDC and federal and state organizations. These policies and algorithms were followed during the patient's care in the ED.  Some ED evaluations and interventions may be delayed as a result of limited staffing during and the pandemic.*   Note:  This document was prepared using Dragon voice recognition software and may include unintentional dictation errors.    Lucy ChrisRodgers, Wrigley Plasencia J, PA 06/27/20 2044    Arnaldo NatalMalinda, Paul F, MD 06/28/20 [place] [email or web address]

## 2020-06-29 LAB — URINE CULTURE

## 2020-07-08 ENCOUNTER — Ambulatory Visit: Payer: Medicaid Other | Admitting: Obstetrics & Gynecology

## 2020-07-13 ENCOUNTER — Encounter: Payer: Self-pay | Admitting: Obstetrics & Gynecology

## 2020-07-13 ENCOUNTER — Other Ambulatory Visit (HOSPITAL_COMMUNITY)
Admission: RE | Admit: 2020-07-13 | Discharge: 2020-07-13 | Disposition: A | Discharge status: Home / Self Care | Payer: Medicaid Other | Source: Ambulatory Visit | Attending: Obstetrics & Gynecology | Admitting: Obstetrics & Gynecology

## 2020-07-13 ENCOUNTER — Ambulatory Visit (INDEPENDENT_AMBULATORY_CARE_PROVIDER_SITE_OTHER): Payer: Medicaid Other | Admitting: Obstetrics & Gynecology

## 2020-07-13 DIAGNOSIS — Z124 Encounter for screening for malignant neoplasm of cervix: Secondary | ICD-10-CM | POA: Insufficient documentation

## 2020-07-13 MED ORDER — MEDROXYPROGESTERONE ACETATE 150 MG/ML IM SUSP
150.0000 mg | INTRAMUSCULAR | 3 refills | Status: DC
Start: 2020-07-13 — End: 2020-08-19

## 2020-07-13 NOTE — Patient Instructions (Signed)
Medroxyprogesterone injection [Contraceptive] What is this medicine? MEDROXYPROGESTERONE (me DROX ee proe JES te rone) contraceptive injections prevent pregnancy. They provide effective birth control for 3 months. Depo-subQ Provera 104 is also used for treating pain related to endometriosis. This medicine may be used for other purposes; ask your health care provider or pharmacist if you have questions. COMMON BRAND NAME(S): Depo-Provera, Depo-subQ Provera 104 What should I tell my health care provider before I take this medicine? They need to know if you have any of these conditions:  frequently drink alcohol  asthma  blood vessel disease or a history of a blood clot in the lungs or legs  bone disease such as osteoporosis  breast cancer  diabetes  eating disorder (anorexia nervosa or bulimia)  high blood pressure  HIV infection or AIDS  kidney disease  liver disease  mental depression  migraine  seizures (convulsions)  stroke  tobacco smoker  vaginal bleeding  an unusual or allergic reaction to medroxyprogesterone, other hormones, medicines, foods, dyes, or preservatives  pregnant or trying to get pregnant  breast-feeding How should I use this medicine? Depo-Provera Contraceptive injection is given into a muscle. Depo-subQ Provera 104 injection is given under the skin. These injections are given by a health care professional. You must not be pregnant before getting an injection. The injection is usually given during the first 5 days after the start of a menstrual period or 6 weeks after delivery of a baby. Talk to your pediatrician regarding the use of this medicine in children. Special care may be needed. These injections have been used in female children who have started having menstrual periods. Overdosage: If you think you have taken too much of this medicine contact a poison control center or emergency room at once. NOTE: This medicine is only for you. Do not  share this medicine with others. What if I miss a dose? Try not to miss a dose. You must get an injection once every 3 months to maintain birth control. If you cannot keep an appointment, call and reschedule it. If you wait longer than 13 weeks between Depo-Provera contraceptive injections or longer than 14 weeks between Depo-subQ Provera 104 injections, you could get pregnant. Use another method for birth control if you miss your appointment. You may also need a pregnancy test before receiving another injection. What may interact with this medicine? Do not take this medicine with any of the following medications:  bosentan This medicine may also interact with the following medications:  aminoglutethimide  antibiotics or medicines for infections, especially rifampin, rifabutin, rifapentine, and griseofulvin  aprepitant  barbiturate medicines such as phenobarbital or primidone  bexarotene  carbamazepine  medicines for seizures like ethotoin, felbamate, oxcarbazepine, phenytoin, topiramate  modafinil  St. John's wort This list may not describe all possible interactions. Give your health care provider a list of all the medicines, herbs, non-prescription drugs, or dietary supplements you use. Also tell them if you smoke, drink alcohol, or use illegal drugs. Some items may interact with your medicine. What should I watch for while using this medicine? This drug does not protect you against HIV infection (AIDS) or other sexually transmitted diseases. Use of this product may cause you to lose calcium from your bones. Loss of calcium may cause weak bones (osteoporosis). Only use this product for more than 2 years if other forms of birth control are not right for you. The longer you use this product for birth control the more likely you will be at risk   for weak bones. Ask your health care professional how you can keep strong bones. You may have a change in bleeding pattern or irregular periods.  Many females stop having periods while taking this drug. If you have received your injections on time, your chance of being pregnant is very low. If you think you may be pregnant, see your health care professional as soon as possible. Tell your health care professional if you want to get pregnant within the next year. The effect of this medicine may last a long time after you get your last injection. What side effects may I notice from receiving this medicine? Side effects that you should report to your doctor or health care professional as soon as possible:  allergic reactions like skin rash, itching or hives, swelling of the face, lips, or tongue  breast tenderness or discharge  breathing problems  changes in vision  depression  feeling faint or lightheaded, falls  fever  pain in the abdomen, chest, groin, or leg  problems with balance, talking, walking  unusually weak or tired  yellowing of the eyes or skin Side effects that usually do not require medical attention (report to your doctor or health care professional if they continue or are bothersome):  acne  fluid retention and swelling  headache  irregular periods, spotting, or absent periods  temporary pain, itching, or skin reaction at site where injected  weight gain This list may not describe all possible side effects. Call your doctor for medical advice about side effects. You may report side effects to FDA at 1-800-FDA-1088. Where should I keep my medicine? This does not apply. The injection will be given to you by a health care professional. NOTE: This sheet is a summary. It may not cover all possible information. If you have questions about this medicine, talk to your doctor, pharmacist, or health care provider.  2020 Elsevier/Gold Standard (2008-09-05 18:37:56)  

## 2020-07-13 NOTE — Progress Notes (Signed)
°  OBSTETRICS POSTPARTUM CLINIC PROGRESS NOTE  Subjective:     Kelly Stevens is a 29 y.o. [phone] female who presents for a postpartum visit. She is 6 weeks postpartum following a Term pregnancy and delivery by C-section.  I have fully reviewed the prenatal and intrapartum course. Anesthesia: epidural.  Postpartum course has been complicated by complicated by hypertension.  Baby is feeding by Bottle.  Bleeding: patient has not  resumed menses.  Bowel function is normal. Bladder function is normal.  Patient is not sexually active. Contraception method desired is Depo-Provera injections.  Postpartum depression screening: negative. Edinburgh 4.  The following portions of the patient's history were reviewed and updated as appropriate: allergies, current medications, past family history, past medical history, past social history, past surgical history and problem list.  Review of Systems Pertinent items are noted in HPI.  Objective:    BP (!) 138/98    Ht 5\' 6"  (1.676 m)    Wt (!) 343 lb (155.6 kg)    BMI 55.36 kg/m   General:  alert and no distress   Breasts:  inspection negative, no nipple discharge or bleeding, no masses or nodularity palpable  Lungs: clear to auscultation bilaterally  Heart:  regular rate and rhythm, S1, S2 normal, no murmur, click, rub or gallop  Abdomen: soft, non-tender; bowel sounds normal; no masses,  no organomegaly.  Well healed Pfannenstiel incision   Vulva:  normal  Vagina: normal vagina, no discharge, exudate, lesion, or erythema  Cervix:  no cervical motion tenderness and no lesions  Corpus: normal size, contour, position, consistency, mobility, non-tender  Adnexa:  normal adnexa and no mass, fullness, tenderness  Rectal Exam: Not performed.          Assessment:  Post Partum Care visit 1. Postpartum exam  2. Screening for cervical cancer - Cytology - PAP   Plan:  See orders and Patient Instructions Resume all normal activities Follow up in:  2 weeks for BP check or as needed.  Depo appt soon All bc dicussed, prefers this method. Due to BP still high, will cont Labetalol for 2 more weeks then reassess  , MD, Annamarie Major Ob/Gyn, [facility] 07/13/2020  5:05 PM

## 2020-07-16 LAB — CYTOLOGY - PAP
Comment: NEGATIVE
Diagnosis: NEGATIVE
High risk HPV: NEGATIVE

## 2020-07-20 ENCOUNTER — Ambulatory Visit: Payer: Medicaid Other | Admitting: Obstetrics & Gynecology

## 2020-08-19 ENCOUNTER — Ambulatory Visit (INDEPENDENT_AMBULATORY_CARE_PROVIDER_SITE_OTHER): Payer: Medicaid Other | Admitting: Obstetrics & Gynecology

## 2020-08-19 ENCOUNTER — Other Ambulatory Visit: Payer: Self-pay

## 2020-08-19 ENCOUNTER — Encounter: Payer: Self-pay | Admitting: Obstetrics & Gynecology

## 2020-08-19 VITALS — BP 130/74 | HR 100 | Ht 66.0 in | Wt 347.0 lb

## 2020-08-19 DIAGNOSIS — Z30015 Encounter for initial prescription of vaginal ring hormonal contraceptive: Secondary | ICD-10-CM

## 2020-08-19 DIAGNOSIS — Z0130 Encounter for examination of blood pressure without abnormal findings: Secondary | ICD-10-CM

## 2020-08-19 MED ORDER — ETONOGESTREL-ETHINYL ESTRADIOL 0.12-0.015 MG/24HR VA RING: VAGINAL_RING | VAGINAL | 12 refills | Status: AC

## 2020-08-19 NOTE — Progress Notes (Signed)
Obstetrics & Gynecology Office Visit   Chief Complaint: BP check  History of Present Illness: 29 y.o. [id number] being seen for follow up blood pressure check today.  The patient is postpartum several weeksThe established diagnosis for the patient is postpartum hypertension.  She is currently on labetalol 200mg .  She reports no current symptoms attributable to her blood pressure.  Medication list reviewed - no other concerns.  She has had a period.   Pt desires birth control.  Was considering Depo, but is concerned about weigth gain potential side effect.  Past Medical History:  Past Medical History:  Diagnosis Date  . BMI 50.0-59.9, adult (HCC)   . Gestational diabetes   . Habitual aborter   . Headache     Past Surgical History:  Past Surgical History:  Procedure Laterality Date  . CESAREAN SECTION  06/03/2020   Procedure: CESAREAN SECTION;  Surgeon: 08/03/2020, MD;  Location: [facility] ORS;  Service: Obstetrics;;    Gynecologic History: Patient's last menstrual period was 08/15/2020.  Obstetric History: 08/17/2020  Family History:  Family History  Problem Relation Age of Onset  . Breast cancer Maternal Aunt 40  . Uterine cancer Maternal Aunt 34  . Diabetes Mother   . Hypertension Mother   . Diabetes Father   . Hypertension Father   . Diabetes Maternal Grandmother   . Hypertension Maternal Grandmother   . Hyperlipidemia Maternal Grandmother   . Heart disease Maternal Grandmother   . Diabetes Maternal Aunt     Social History:  Social History   Socioeconomic History  . Marital status: Married    Spouse name: [id number]  . Number of children: Not on file  . Years of education: Not on file  . Highest education level: Not on file  Occupational History  . Not on file  Tobacco Use  . Smoking status: Former Smoker    Packs/day: 0.10    Years: 4.00    Pack years: 0.40    Types: Cigarettes    Quit date: 09/25/2017    Years since quitting: 2.9  . Smokeless  tobacco: Former 09/27/2017  . Tobacco comment: not consistent  Vaping Use  . Vaping Use: Never used  Substance and Sexual Activity  . Alcohol use: No  . Drug use: No  . Sexual activity: Yes    Birth control/protection: Pill    Comment: Wants education  Other Topics Concern  . Not on file  Social History Narrative  . Not on file   Social Determinants of Health   Financial Resource Strain: Not on file  Food Insecurity: Not on file  Transportation Needs: Not on file  Physical Activity: Not on file  Stress: Not on file  Social Connections: Not on file  Intimate Partner Violence: Not on file    Allergies:  Allergies  Allergen Reactions  . Nickel Rash    Medications: Prior to Admission medications   Medication Sig Start Date End Date Taking? Authorizing Provider  acetaminophen (TYLENOL) 325 MG tablet Take 2 tablets (650 mg total) by mouth every 4 (four) hours as needed for mild pain (temperature > 101.5.). 06/05/20  Yes Schuman, 08/05/20, MD  Prenatal Vit-Fe Fumarate-FA (PRENATAL MULTIVITAMIN) TABS tablet Take 1 tablet by mouth daily at 12 noon.   Yes [provider]  etonogestrel-ethinyl estradiol (NUVARING) 0.12-0.015 MG/24HR vaginal ring Insert vaginally and leave in place for 24 consecutive days, then remove for 4 days. 08/19/20   08/21/20, MD  Review of Systems  All other systems reviewed and are negative.   Physical Exam Blood pressure 130/74, pulse 100, height 5\' 6"  (1.676 m), weight (!) 347 lb (157.4 kg), last menstrual period 08/15/2020, currently breastfeeding.  Patient's last menstrual period was 08/15/2020.  General: NAD HEENT: normocephalic, anicteric Extremities: 2+edema, no erythema, no tenderness Neurologic: Grossly intact Psychiatric: mood appropriate, affect full  Assessment: 29 y.o. 26 presenting for blood pressure evaluation today and also contraceptive mgt  Plan: Problem List Items Addressed This Visit    Visit Diagnoses     BP check    -  Primary   Encounter for initial prescription of vaginal ring hormonal contraceptive          1) Blood pressure - blood pressure at today's visit is normotensive.  As a result antihypertensive therapy is currently not warranted. - additional blood work was not obtained   2) Contraception discussed. Desires Nuvaring.  Info provided.  Rx.  OCPs and Rings The risks /benefits of OCPs have been explained to the patient in detail.  Product literature has been given to her.  I have instructed her in the use of OCPs and have given her literature reinforcing this information.  I have explained to the patient that OCPs are not as effective for birth control during the first month of use, and that another form of contraception should be used during this time.  Both first-day start and Sunday start have been explained.  The risks and benefits of each was discussed.  She has been made aware of  the fact that other medications may affect the efficacy of OCPs.  I have answered all of her questions, and I believe that she has an understanding of the effectiveness and use of OCPs.    Sunday, MD, [facility], [facility] 08/19/2020  4:54 PM

## 2021-10-26 ENCOUNTER — Other Ambulatory Visit: Payer: Self-pay

## 2022-06-20 IMAGING — US US MFM FETAL BPP W/O NON-STRESS
1 series · 14 of 20 positions shown · non-contrast
Comparison: none

[Series 1: us mfm fetal bpp w/o non-stress · 20 acquisitions, 14 frames shown]
[im 1/20]
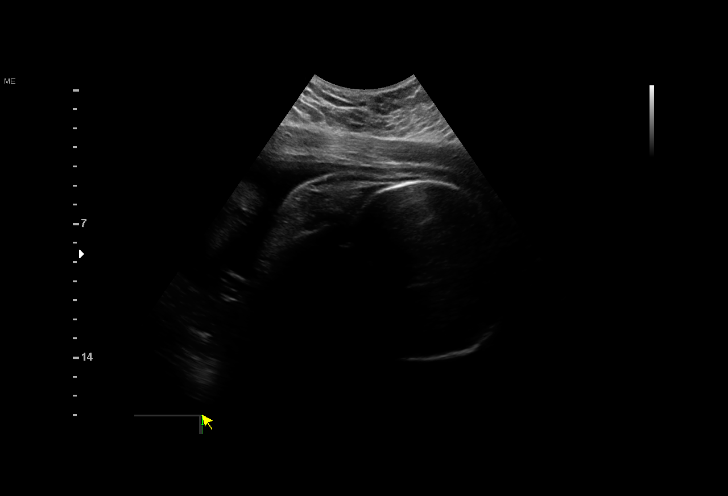
[im 3/20]
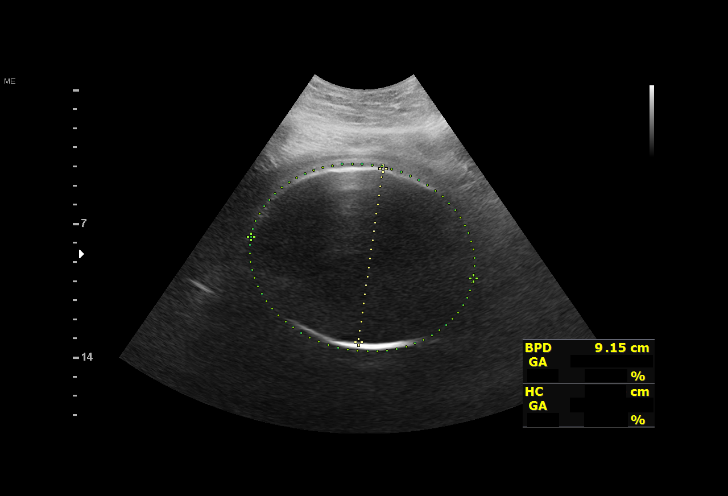
[im 4/20]
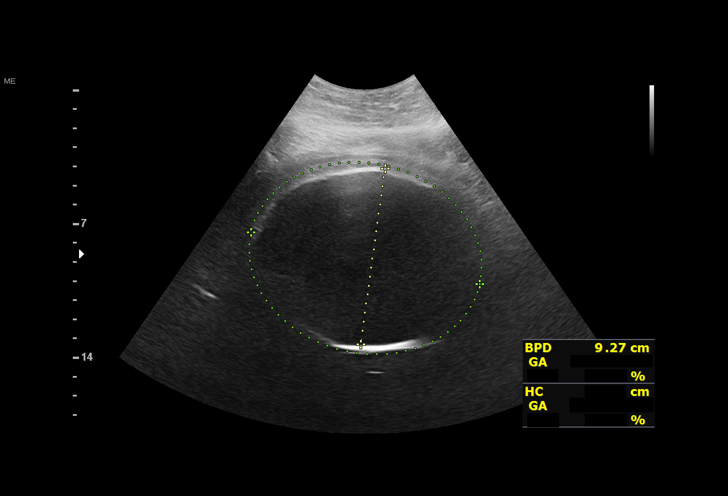
[im 6/20]
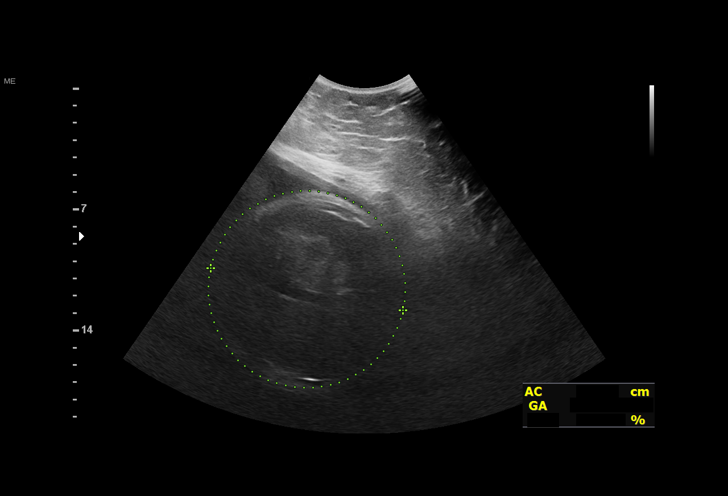
[im 7/20]
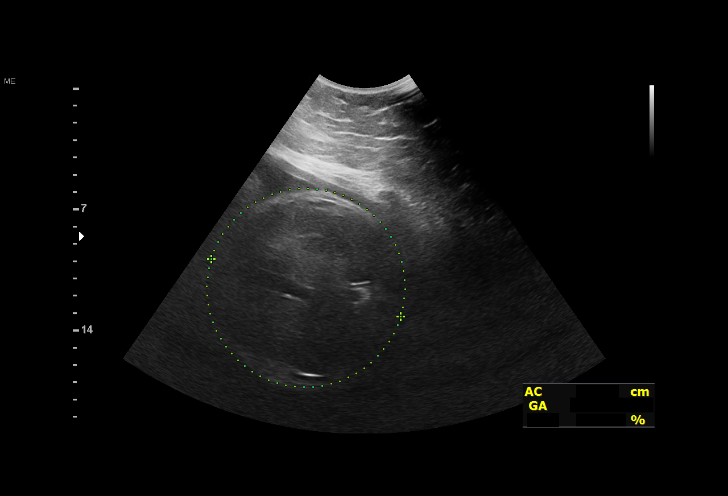
[im 8/20]
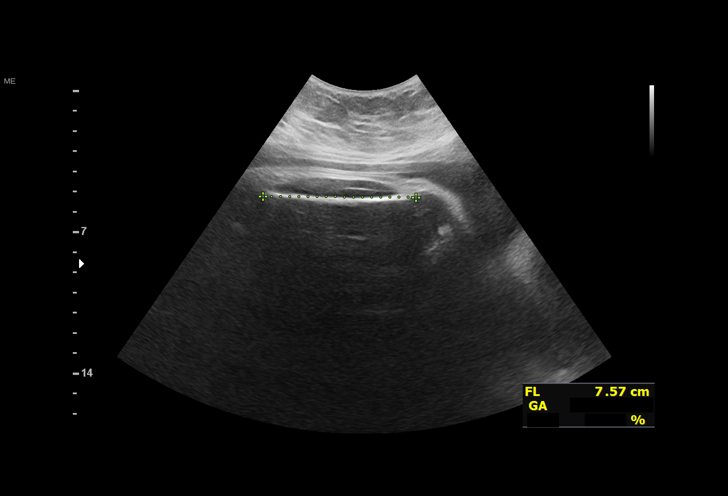
[im 10/20]
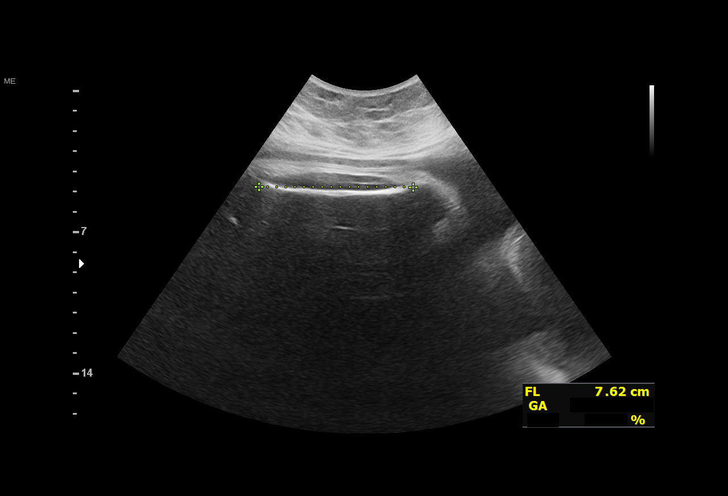
[im 11/20]
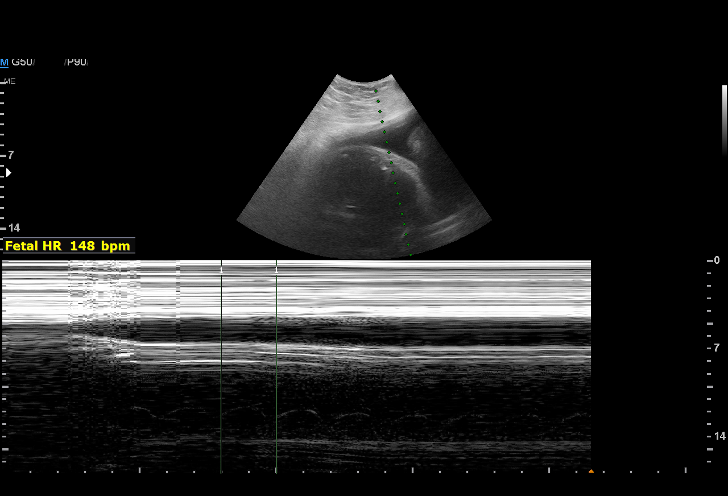
[im 13/20]
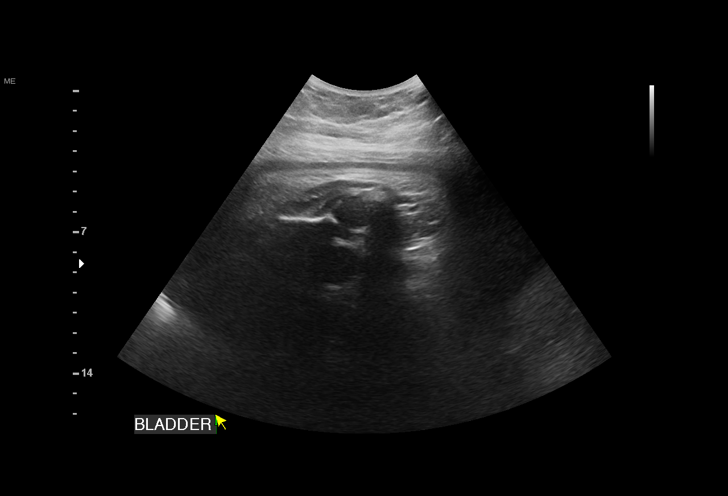
[im 14/20]
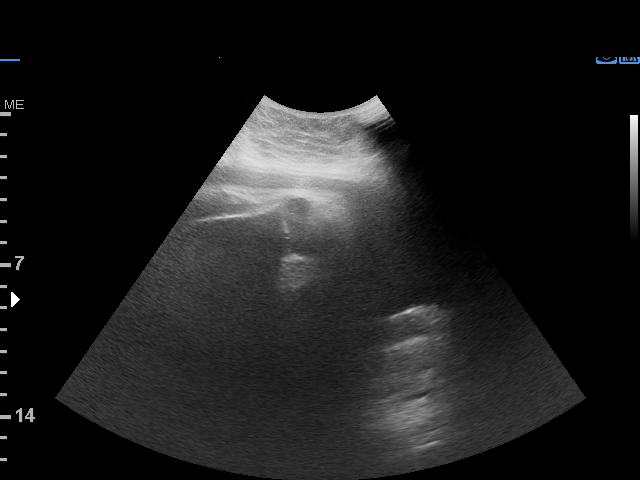
[im 16/20]
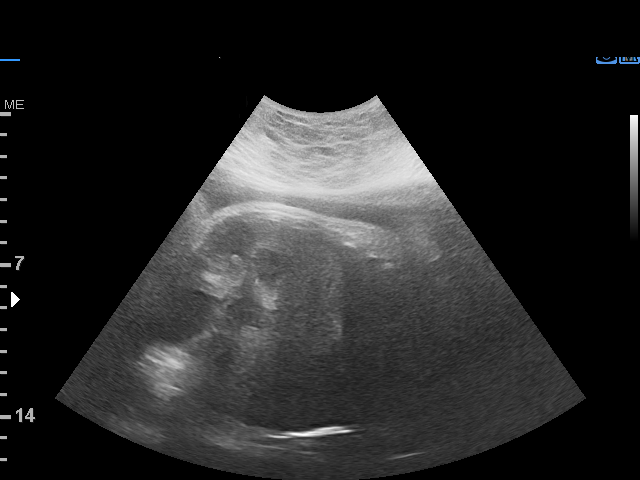
[im 17/20]
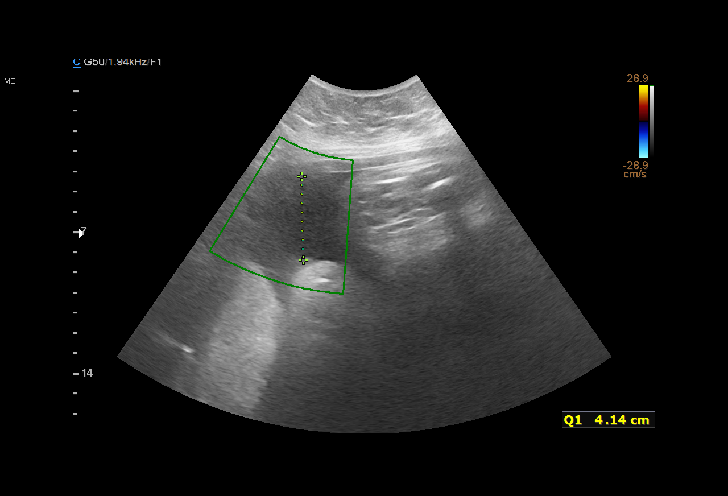
[im 18/20]
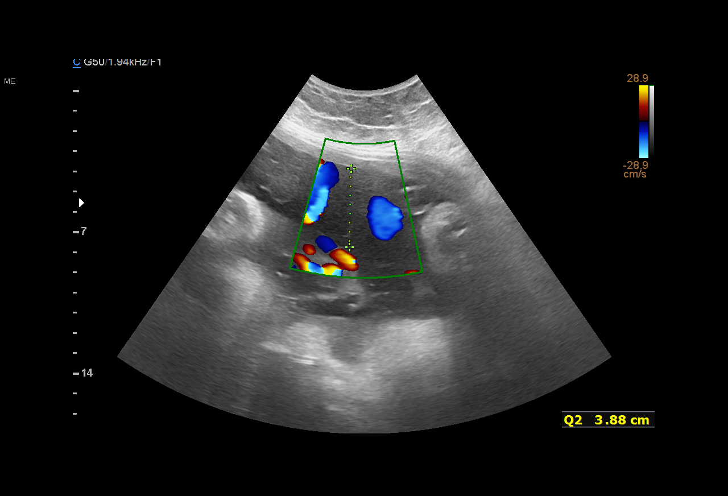
[im 20/20]
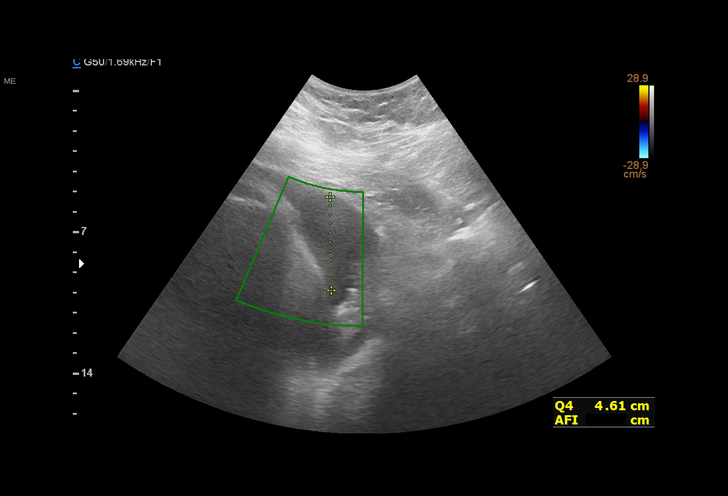

[14 of 20 positions shown; findings below may reference images not displayed]

Maternal [HOSPITAL]
                                                            at [REDACTED]
                   BOS[facility]

                                                      BOSIGA
    STRESS                                            BOSIGA

Indications

 37 weeks gestation of pregnancy
 Gestational hypertension without significant
 proteinuria, third trimester
 Gestational diabetes in pregnancy, insulin
 controlled
 Obesity complicating pregnancy, third
 trimester
Fetal Evaluation

 Num Of Fetuses:         1
 Fetal Heart Rate(bpm):  148
 Cardiac Activity:       Observed
 Presentation:           Cephalic
 Placenta:               Posterior

 AFI Sum(cm)     %Tile       Largest Pocket(cm)
 17.4            67

 RUQ(cm)       RLQ(cm)       LUQ(cm)        LLQ(cm)

Biophysical Evaluation
 Amniotic F.V:   Within normal limits       F. Tone:        Observed
 F. Movement:    Observed                   Score:          [DATE]
 F. Breathing:   Observed
Biometry

 BPD:      92.5  mm     G. Age:  37w 4d         75  %    CI:        72.22   %    70 - 86
                                                         FL/HC:      21.9   %    20.8 -
 HC:      346.3  mm     G. Age:  40w 1d         88  %    HC/AC:      0.96        0.92 -
 AC:      359.7  mm     G. Age:  39w 6d         99  %    FL/BPD:     82.1   %    71 - 87
 FL:       75.9  mm     G. Age:  38w 6d         85  %    FL/AC:      21.1   %    20 - 24

 Est. FW:    6386  gm      8 lb 5 oz     96  %
Gestational Age

 LMP:           37w 2d        Date:  09/10/19                 EDD:   06/16/20
 U/S Today:     39w 1d                                        EDD:   06/03/20
 Best:          37w 2d     Det. By:  LMP  (09/10/19)          EDD:   06/16/20
Anatomy

 Stomach:               Appears normal, left   Bladder:                Appears normal
                        sided
Impression

 Follow up growth and antenatal testing performed today.
 Interval growth was greater than date today with an AC at the
 99th%.
 Antenatal testing performed given maternal [id number]
 The biophysical profile was [DATE] with good fetal movement and
 amniotic fluid volume.

 Her blood glucose were reviewed. Her fasting blood glucose
 log was brought today. However, she notes that with a recent
 change her FBS <95 and her 2hr PP was less than 125,

 She reports good fetal movement
Recommendations

 Continue weekly testing
 Consider delivery between 37-39 weeks

## 2022-06-28 IMAGING — US US RENAL
1 series · 14 of 22 positions shown · non-contrast
Comparison: None.

CLINICAL DATA: Acute kidney injury

EXAM:
RENAL ULTRASOUND

[Series 1: us renal · 14 of 22 slices shown]
[im 1/22]
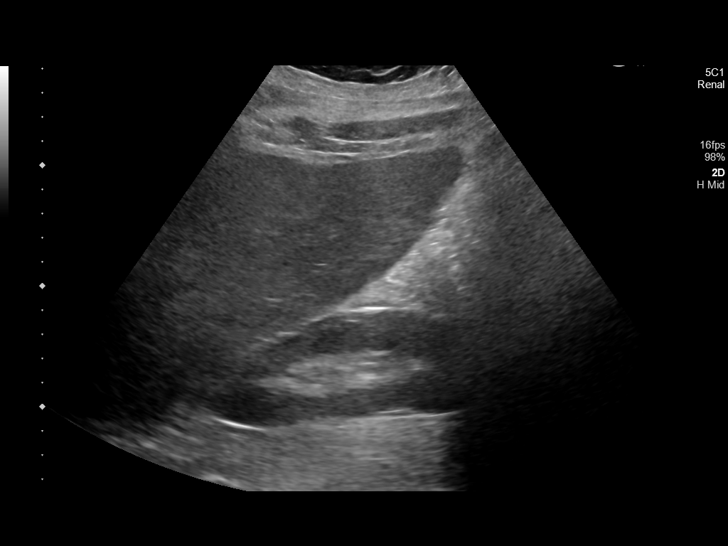
[im 3/22]
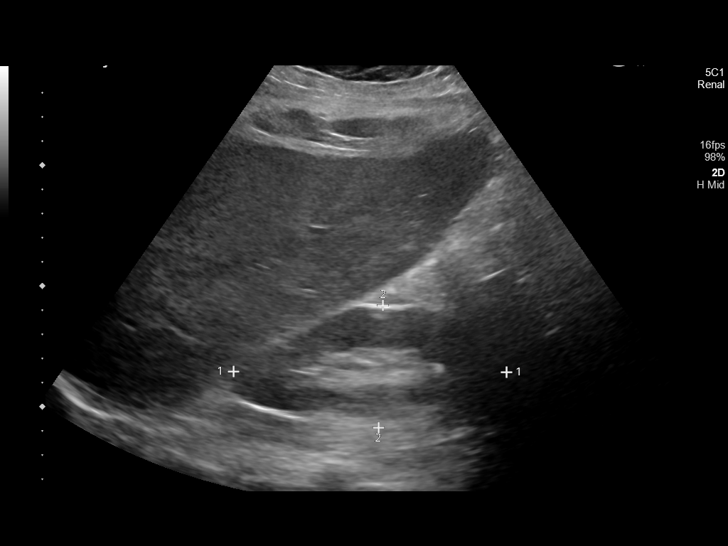
[im 4/22]
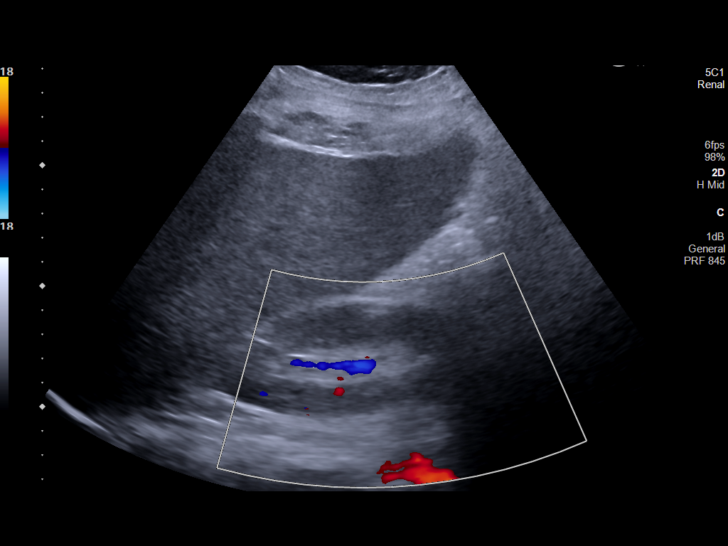
[im 6/22]
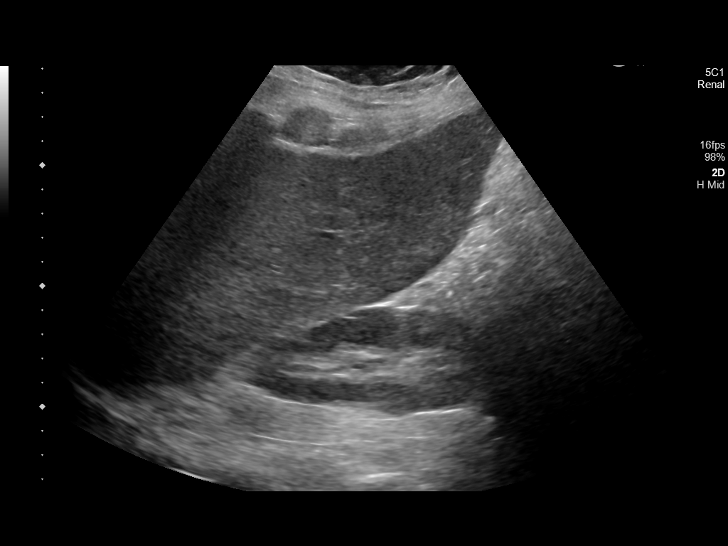
[im 8/22]
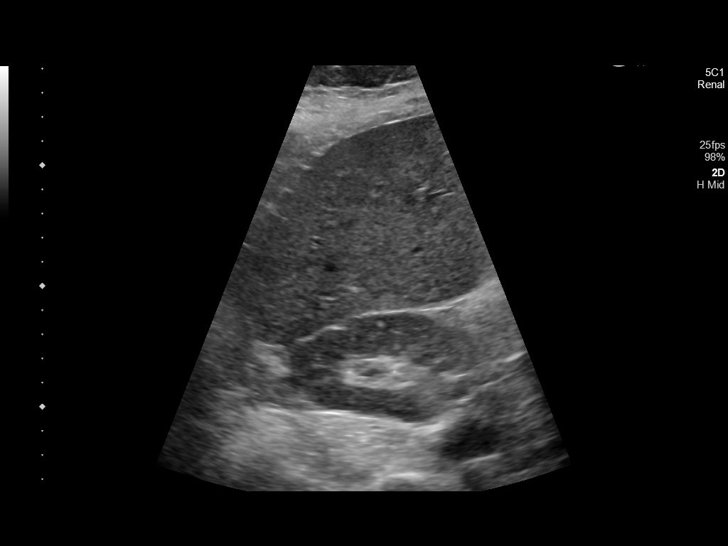
[im 9/22]
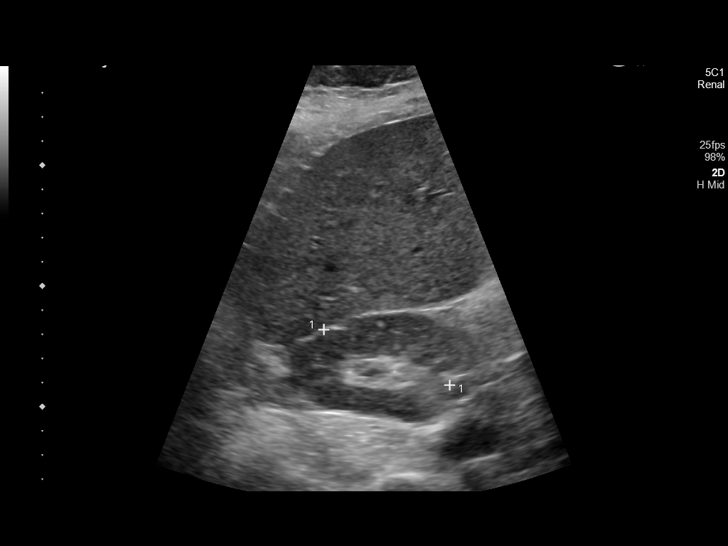
[im 11/22]
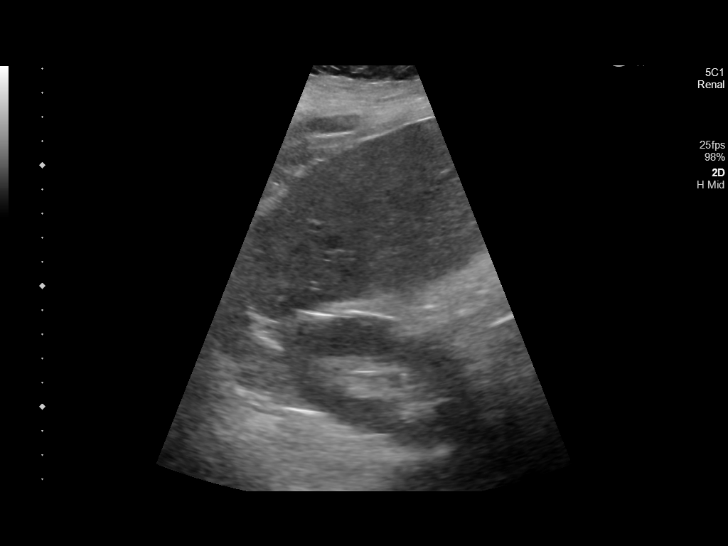
[im 12/22]
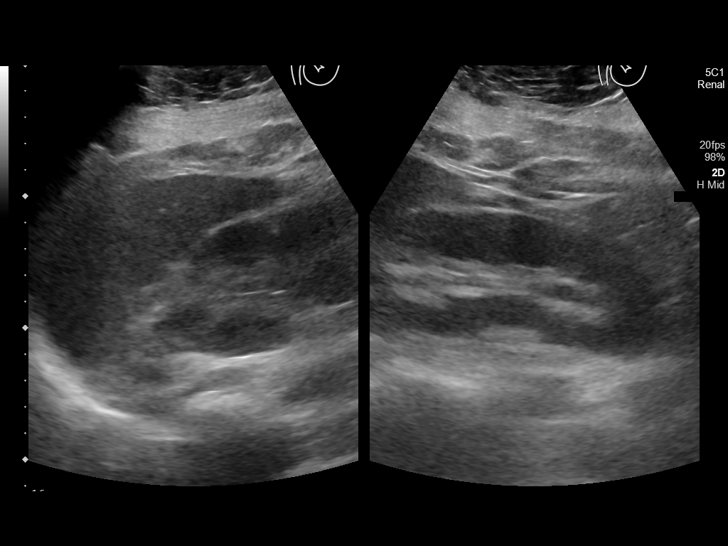
[im 14/22]
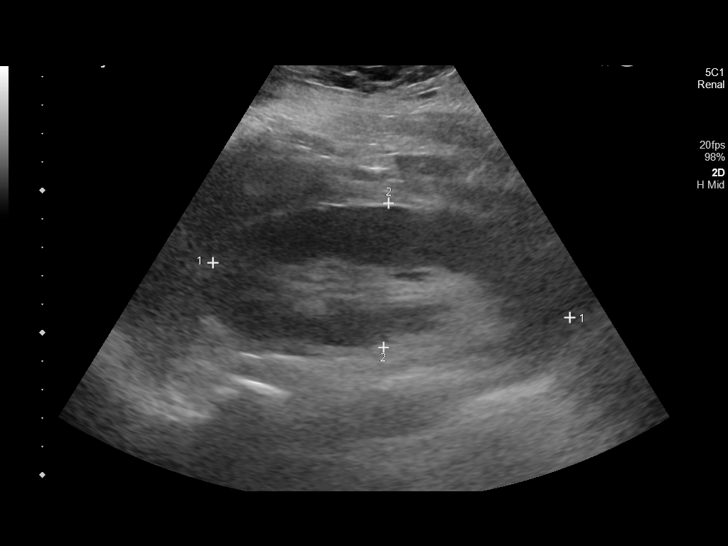
[im 15/22]
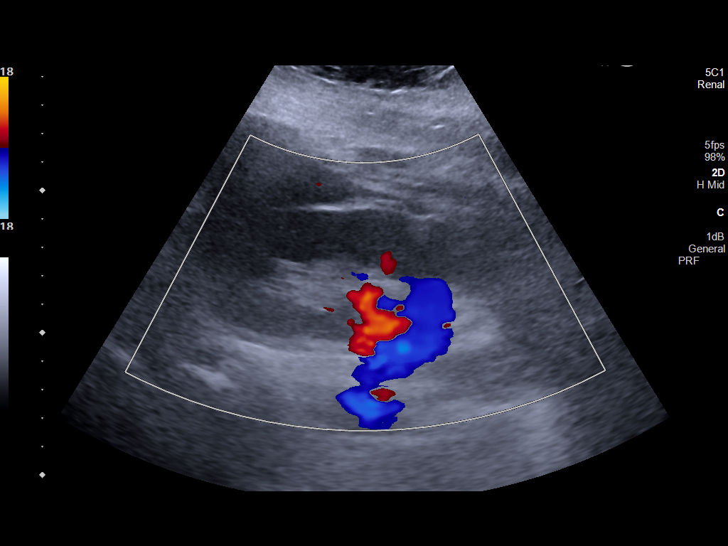
[im 17/22]
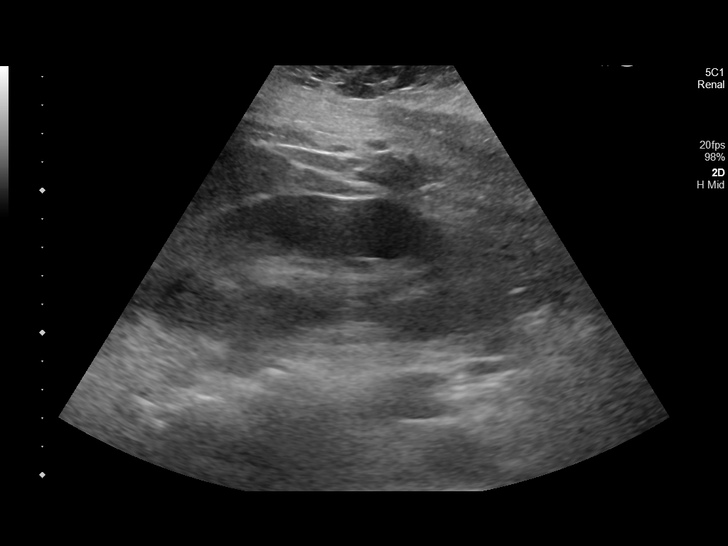
[im 19/22]
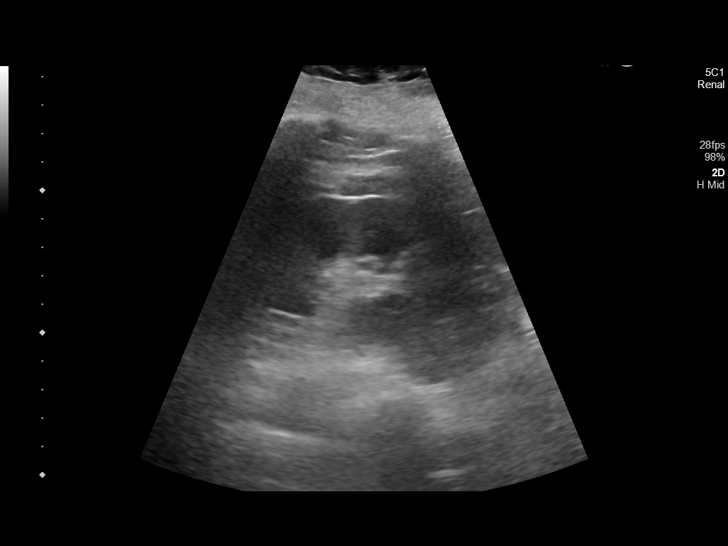
[im 20/22]
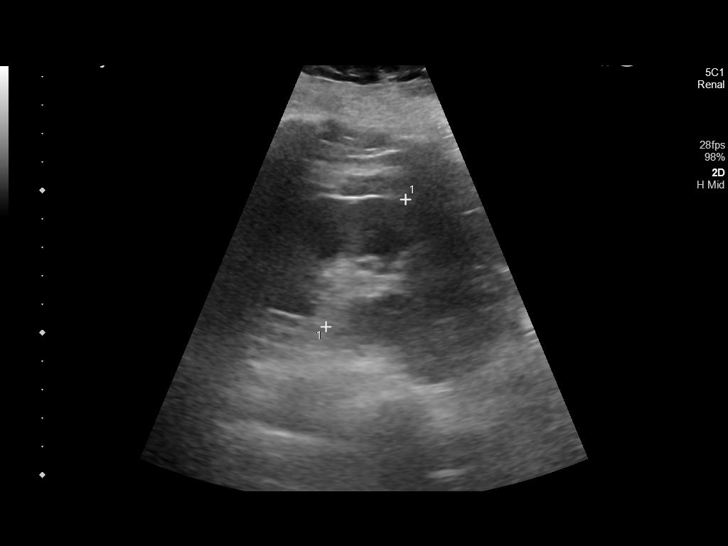
[im 22/22]
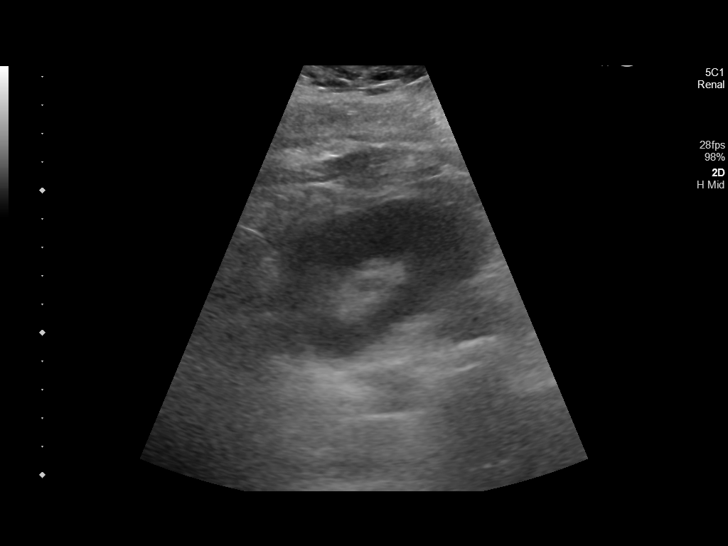

[14 of 22 positions shown; findings below may reference images not displayed]

FINDINGS: Right Kidney:

Renal measurements: 11.3 x 5.1 x 5.7 cm = volume: 171.1 mL.
Echogenicity and renal cortical thickness are within normal limits.
No mass, perinephric fluid, or hydronephrosis visualized. No
sonographically demonstrable calculus or ureterectasis.

Left Kidney:

Renal measurements: 12.7 x 5.1 x 5.3 cm = volume: 177.9 mL.
Echogenicity and renal cortical thickness are within normal limits.
No mass, perinephric fluid, or hydronephrosis visualized. No
sonographically demonstrable calculus or ureterectasis.

Bladder:

Overlying bandage precludes sonographic assessment of the bladder.

Other:

None.
IMPRESSION: Normal appearing kidneys bilaterally by ultrasound.
